# Patient Record
Sex: Female | Born: 1970 | Race: White | Hispanic: No | Smoking: Current every day smoker
Health system: Southern US, Community
[De-identification: ages and names within clinical notes are randomized; demographics above are authoritative.]

## PROBLEM LIST (undated history)

## (undated) DIAGNOSIS — Z9889 Other specified postprocedural states: Secondary | ICD-10-CM

## (undated) DIAGNOSIS — F319 Bipolar disorder, unspecified: Secondary | ICD-10-CM

## (undated) DIAGNOSIS — F429 Obsessive-compulsive disorder, unspecified: Secondary | ICD-10-CM

## (undated) DIAGNOSIS — F909 Attention-deficit hyperactivity disorder, unspecified type: Secondary | ICD-10-CM

## (undated) HISTORY — PX: TONSILLECTOMY AND ADENOIDECTOMY: SHX28

## (undated) HISTORY — DX: Other specified postprocedural states: Z98.890

## (undated) HISTORY — PX: TUBAL LIGATION: SHX77

## (undated) HISTORY — PX: OTHER SURGICAL HISTORY: SHX169

## (undated) HISTORY — DX: Attention-deficit hyperactivity disorder, unspecified type: F90.9

## (undated) HISTORY — DX: Obsessive-compulsive disorder, unspecified: F42.9

## (undated) HISTORY — DX: Bipolar disorder, unspecified: F31.9

---

## 1999-05-17 ENCOUNTER — Encounter (INDEPENDENT_AMBULATORY_CARE_PROVIDER_SITE_OTHER): Payer: Self-pay | Admitting: *Deleted

## 1999-05-17 ENCOUNTER — Ambulatory Visit (HOSPITAL_BASED_OUTPATIENT_CLINIC_OR_DEPARTMENT_OTHER): Admission: RE | Admit: 1999-05-17 | Discharge: 1999-05-18 | Payer: Self-pay | Admitting: *Deleted

## 1999-06-02 ENCOUNTER — Other Ambulatory Visit: Admission: RE | Admit: 1999-06-02 | Discharge: 1999-06-02 | Payer: Self-pay | Admitting: Obstetrics and Gynecology

## 2000-06-22 ENCOUNTER — Inpatient Hospital Stay (HOSPITAL_COMMUNITY): Admission: EM | Admit: 2000-06-22 | Discharge: 2000-06-27 | Payer: Self-pay | Admitting: *Deleted

## 2000-08-22 ENCOUNTER — Ambulatory Visit (HOSPITAL_COMMUNITY): Admission: RE | Admit: 2000-08-22 | Discharge: 2000-08-22 | Payer: Self-pay | Admitting: Family Medicine

## 2000-08-22 ENCOUNTER — Encounter: Payer: Self-pay | Admitting: Family Medicine

## 2000-09-12 ENCOUNTER — Ambulatory Visit (HOSPITAL_COMMUNITY): Admission: RE | Admit: 2000-09-12 | Discharge: 2000-09-12 | Payer: Self-pay | Admitting: Psychiatry

## 2000-11-24 ENCOUNTER — Encounter: Admission: RE | Admit: 2000-11-24 | Discharge: 2000-11-24 | Payer: Self-pay | Admitting: *Deleted

## 2001-01-15 ENCOUNTER — Encounter: Admission: RE | Admit: 2001-01-15 | Discharge: 2001-01-15 | Payer: Self-pay | Admitting: *Deleted

## 2001-09-12 ENCOUNTER — Encounter: Admission: RE | Admit: 2001-09-12 | Discharge: 2001-09-12 | Payer: Self-pay | Admitting: *Deleted

## 2001-10-24 ENCOUNTER — Inpatient Hospital Stay (HOSPITAL_COMMUNITY): Admission: AD | Admit: 2001-10-24 | Discharge: 2001-10-26 | Payer: Self-pay | Admitting: Obstetrics & Gynecology

## 2001-10-25 ENCOUNTER — Encounter: Payer: Self-pay | Admitting: Obstetrics and Gynecology

## 2001-12-03 ENCOUNTER — Encounter: Payer: Self-pay | Admitting: Obstetrics and Gynecology

## 2001-12-03 ENCOUNTER — Ambulatory Visit (HOSPITAL_COMMUNITY): Admission: RE | Admit: 2001-12-03 | Discharge: 2001-12-03 | Payer: Self-pay | Admitting: Obstetrics and Gynecology

## 2001-12-09 ENCOUNTER — Ambulatory Visit (HOSPITAL_COMMUNITY): Admission: AD | Admit: 2001-12-09 | Discharge: 2001-12-09 | Payer: Self-pay | Admitting: Obstetrics and Gynecology

## 2001-12-13 ENCOUNTER — Ambulatory Visit (HOSPITAL_COMMUNITY): Admission: AD | Admit: 2001-12-13 | Discharge: 2001-12-13 | Payer: Self-pay | Admitting: Obstetrics & Gynecology

## 2001-12-16 ENCOUNTER — Ambulatory Visit (HOSPITAL_COMMUNITY): Admission: RE | Admit: 2001-12-16 | Discharge: 2001-12-16 | Payer: Self-pay | Admitting: Obstetrics and Gynecology

## 2001-12-19 ENCOUNTER — Ambulatory Visit (HOSPITAL_COMMUNITY): Admission: AD | Admit: 2001-12-19 | Discharge: 2001-12-19 | Payer: Self-pay | Admitting: Obstetrics & Gynecology

## 2001-12-23 ENCOUNTER — Inpatient Hospital Stay (HOSPITAL_COMMUNITY): Admission: RE | Admit: 2001-12-23 | Discharge: 2001-12-26 | Payer: Self-pay | Admitting: Obstetrics & Gynecology

## 2002-01-14 ENCOUNTER — Encounter: Admission: RE | Admit: 2002-01-14 | Discharge: 2002-01-14 | Payer: Self-pay | Admitting: *Deleted

## 2002-02-26 ENCOUNTER — Ambulatory Visit (HOSPITAL_COMMUNITY): Admission: RE | Admit: 2002-02-26 | Discharge: 2002-02-26 | Payer: Self-pay | Admitting: Obstetrics & Gynecology

## 2002-05-11 ENCOUNTER — Emergency Department (HOSPITAL_COMMUNITY): Admission: EM | Admit: 2002-05-11 | Discharge: 2002-05-11 | Payer: Self-pay | Admitting: *Deleted

## 2002-05-11 ENCOUNTER — Encounter: Payer: Self-pay | Admitting: *Deleted

## 2002-09-12 ENCOUNTER — Encounter: Admission: RE | Admit: 2002-09-12 | Discharge: 2002-09-12 | Payer: Self-pay | Admitting: *Deleted

## 2004-08-17 ENCOUNTER — Ambulatory Visit (HOSPITAL_COMMUNITY): Admission: RE | Admit: 2004-08-17 | Discharge: 2004-08-17 | Payer: Self-pay | Admitting: Family Medicine

## 2004-09-14 ENCOUNTER — Ambulatory Visit (HOSPITAL_COMMUNITY): Admission: RE | Admit: 2004-09-14 | Discharge: 2004-09-14 | Payer: Self-pay | Admitting: Family Medicine

## 2004-09-21 ENCOUNTER — Ambulatory Visit (HOSPITAL_COMMUNITY): Admission: RE | Admit: 2004-09-21 | Discharge: 2004-09-21 | Payer: Self-pay | Admitting: Family Medicine

## 2004-11-18 ENCOUNTER — Inpatient Hospital Stay (HOSPITAL_COMMUNITY): Admission: RE | Admit: 2004-11-18 | Discharge: 2004-11-22 | Payer: Self-pay | Admitting: Psychiatry

## 2004-11-19 ENCOUNTER — Ambulatory Visit: Payer: Self-pay | Admitting: Psychiatry

## 2005-07-16 ENCOUNTER — Emergency Department (HOSPITAL_COMMUNITY): Admission: EM | Admit: 2005-07-16 | Discharge: 2005-07-16 | Payer: Self-pay | Admitting: Emergency Medicine

## 2005-07-19 ENCOUNTER — Emergency Department (HOSPITAL_COMMUNITY): Admission: EM | Admit: 2005-07-19 | Discharge: 2005-07-19 | Payer: Self-pay | Admitting: Emergency Medicine

## 2006-04-24 ENCOUNTER — Ambulatory Visit: Payer: Self-pay | Admitting: Psychiatry

## 2006-04-24 ENCOUNTER — Inpatient Hospital Stay (HOSPITAL_COMMUNITY): Admission: RE | Admit: 2006-04-24 | Discharge: 2006-04-28 | Payer: Self-pay | Admitting: Psychiatry

## 2007-03-13 IMAGING — CR DG LUMBAR SPINE COMPLETE 4+V
5 series · 5 of 5 positions shown · non-contrast
Comparison: none

CLINICAL DATA: 33-year-old female, low back pain radiating into the left lower extremity. 
 LUMBAR SPINE ? 5 VIEW:

[view not recorded (1 of 5)]
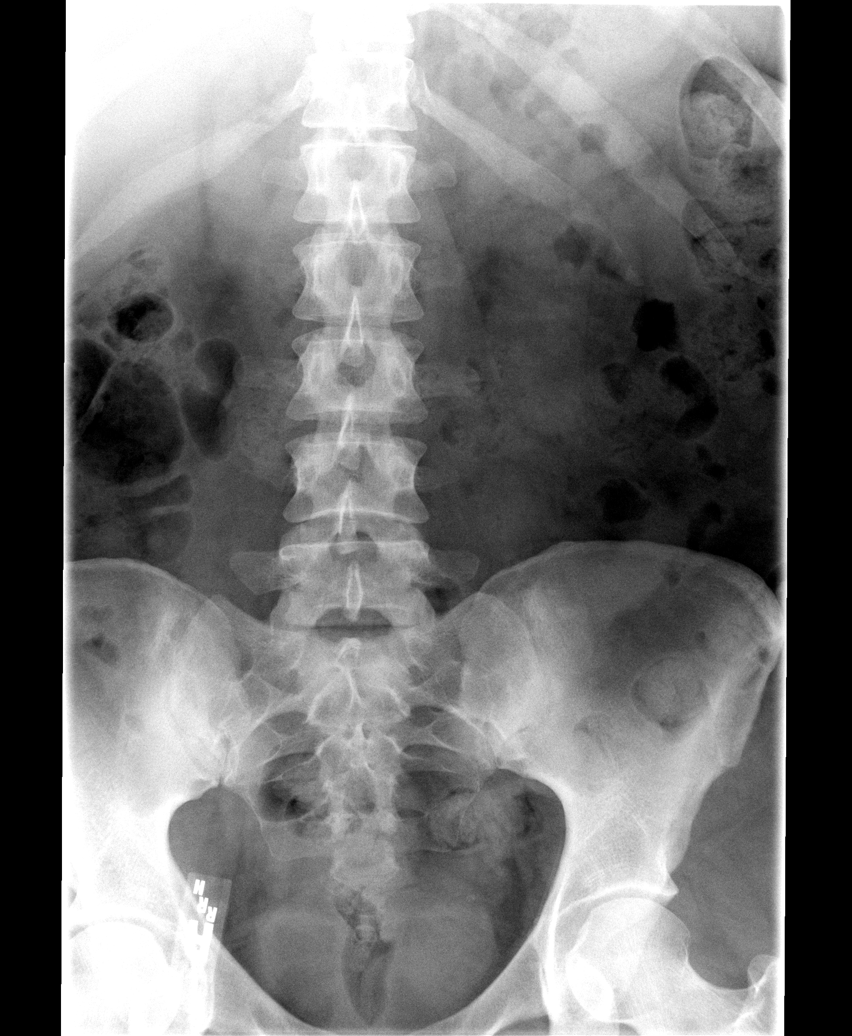

[view not recorded (2 of 5)]
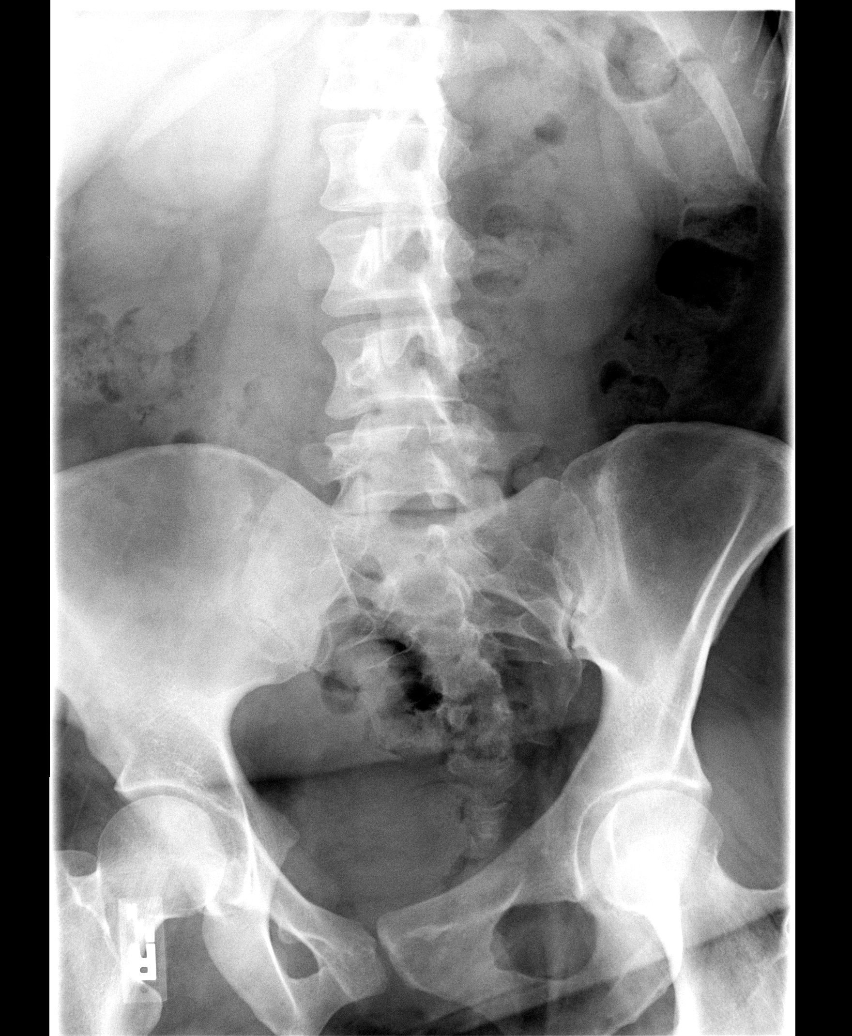

[view not recorded (3 of 5)]
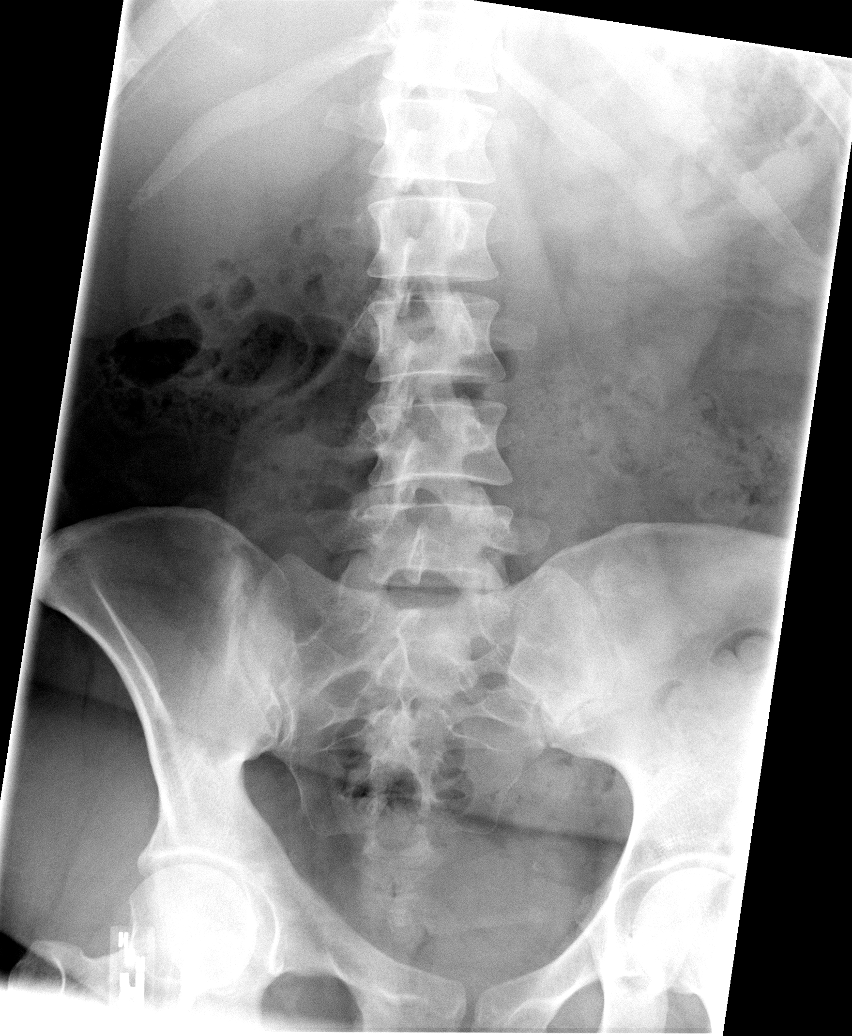

[view not recorded (4 of 5)]
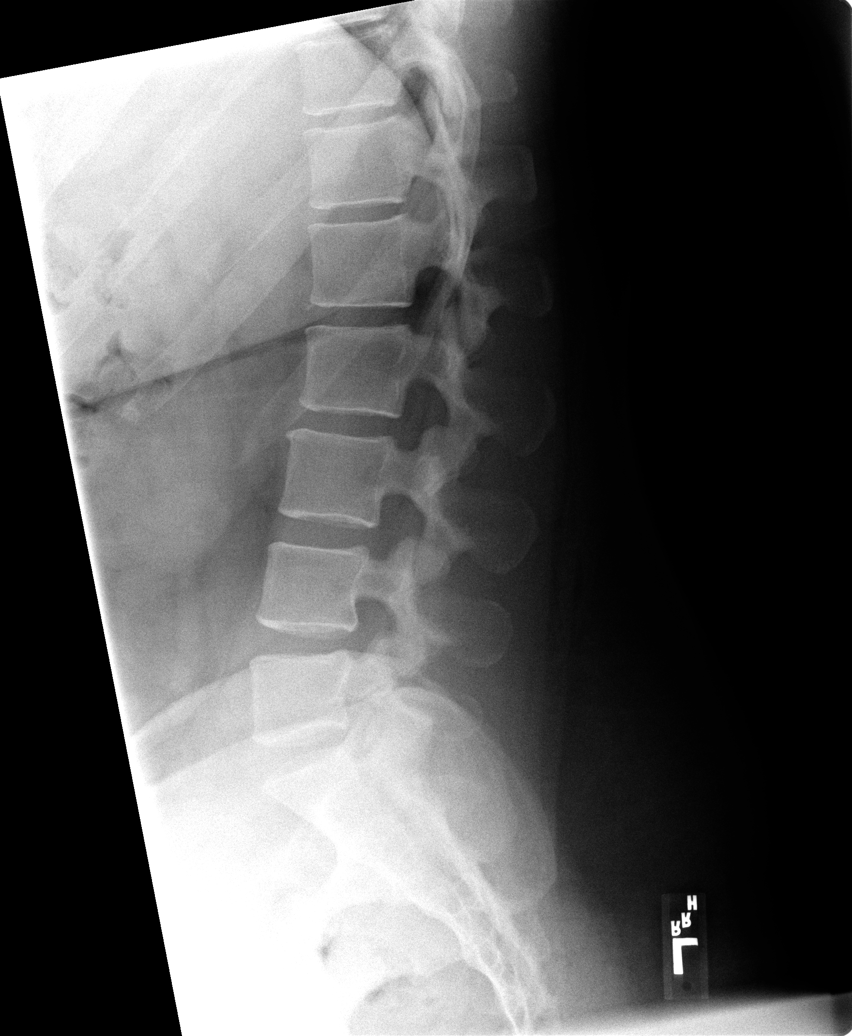

[view not recorded (5 of 5)]
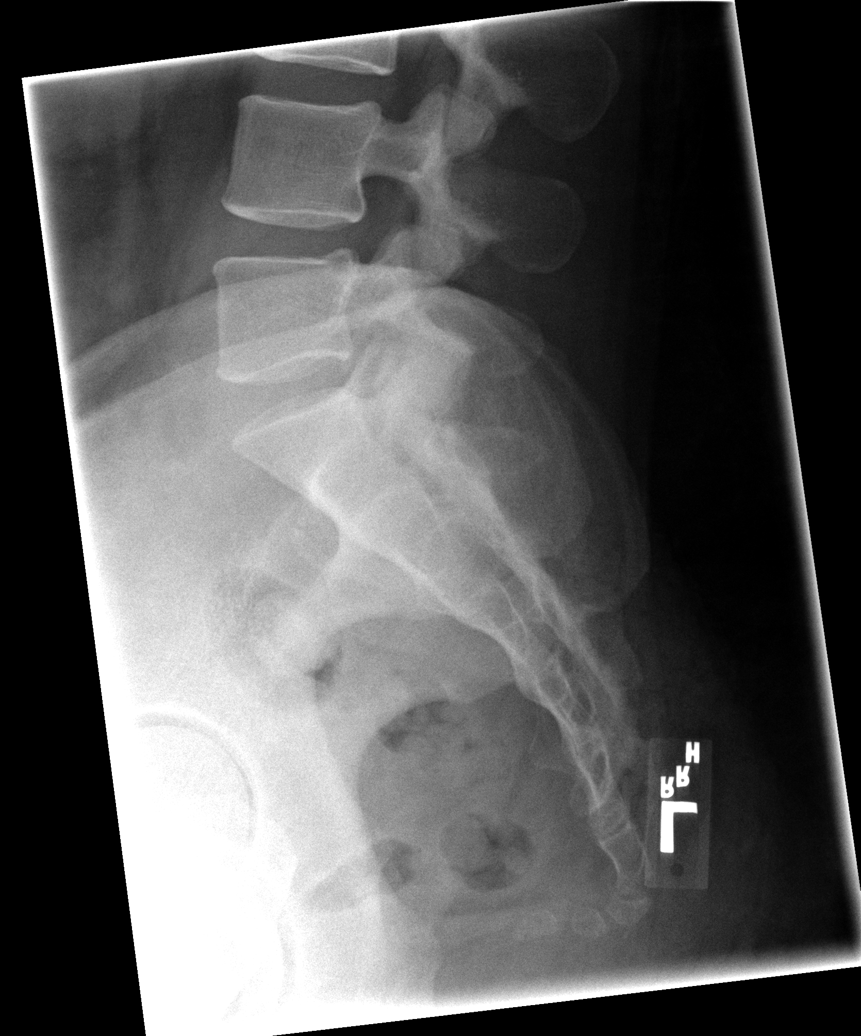

[5 of 5 positions shown; findings below may reference images not displayed]

FINDINGS: No compression fracture deformity.  Alignment is anatomic.  Vertebral body heights and disc spaces are preserved.  Pedicles are intact.  No definite pars defects.
IMPRESSION: No acute finding by plain radiography.

## 2007-05-30 ENCOUNTER — Emergency Department (HOSPITAL_COMMUNITY): Admission: EM | Admit: 2007-05-30 | Discharge: 2007-05-30 | Payer: Self-pay | Admitting: Emergency Medicine

## 2007-08-01 ENCOUNTER — Emergency Department (HOSPITAL_COMMUNITY): Admission: EM | Admit: 2007-08-01 | Discharge: 2007-08-01 | Payer: Self-pay | Admitting: Emergency Medicine

## 2008-01-08 ENCOUNTER — Emergency Department (HOSPITAL_COMMUNITY): Admission: EM | Admit: 2008-01-08 | Discharge: 2008-01-09 | Payer: Self-pay | Admitting: Emergency Medicine

## 2008-02-25 ENCOUNTER — Ambulatory Visit (HOSPITAL_COMMUNITY): Admission: RE | Admit: 2008-02-25 | Discharge: 2008-02-25 | Payer: Self-pay | Admitting: Family Medicine

## 2008-03-10 ENCOUNTER — Encounter (HOSPITAL_COMMUNITY): Admission: RE | Admit: 2008-03-10 | Discharge: 2008-04-09 | Payer: Self-pay | Admitting: Orthopaedic Surgery

## 2008-03-12 ENCOUNTER — Ambulatory Visit (HOSPITAL_COMMUNITY): Admission: RE | Admit: 2008-03-12 | Discharge: 2008-03-12 | Payer: Self-pay | Admitting: Family Medicine

## 2008-04-07 ENCOUNTER — Other Ambulatory Visit: Admission: RE | Admit: 2008-04-07 | Discharge: 2008-04-07 | Payer: Self-pay | Admitting: Obstetrics and Gynecology

## 2008-05-07 ENCOUNTER — Ambulatory Visit (HOSPITAL_COMMUNITY): Admission: RE | Admit: 2008-05-07 | Discharge: 2008-05-07 | Payer: Self-pay | Admitting: Obstetrics & Gynecology

## 2008-05-07 ENCOUNTER — Encounter: Payer: Self-pay | Admitting: Obstetrics & Gynecology

## 2008-05-16 ENCOUNTER — Inpatient Hospital Stay (HOSPITAL_COMMUNITY): Admission: AD | Admit: 2008-05-16 | Discharge: 2008-05-19 | Payer: Self-pay | Admitting: *Deleted

## 2008-05-16 ENCOUNTER — Ambulatory Visit: Payer: Self-pay | Admitting: *Deleted

## 2009-06-03 DIAGNOSIS — Z9889 Other specified postprocedural states: Secondary | ICD-10-CM

## 2009-06-03 HISTORY — DX: Other specified postprocedural states: Z98.890

## 2009-07-21 ENCOUNTER — Ambulatory Visit: Payer: Self-pay | Admitting: Gastroenterology

## 2009-07-21 DIAGNOSIS — K219 Gastro-esophageal reflux disease without esophagitis: Secondary | ICD-10-CM

## 2009-07-21 DIAGNOSIS — R1013 Epigastric pain: Secondary | ICD-10-CM

## 2009-07-28 ENCOUNTER — Encounter: Payer: Self-pay | Admitting: Gastroenterology

## 2009-07-29 ENCOUNTER — Ambulatory Visit (HOSPITAL_COMMUNITY): Admission: RE | Admit: 2009-07-29 | Discharge: 2009-07-29 | Payer: Self-pay | Admitting: Gastroenterology

## 2009-07-29 ENCOUNTER — Ambulatory Visit: Payer: Self-pay | Admitting: Gastroenterology

## 2009-08-17 ENCOUNTER — Encounter: Payer: Self-pay | Admitting: Gastroenterology

## 2009-08-24 ENCOUNTER — Encounter: Payer: Self-pay | Admitting: Gastroenterology

## 2009-09-04 ENCOUNTER — Encounter: Payer: Self-pay | Admitting: Gastroenterology

## 2009-09-09 ENCOUNTER — Encounter (HOSPITAL_COMMUNITY): Admission: RE | Admit: 2009-09-09 | Discharge: 2009-09-09 | Payer: Self-pay | Admitting: Gastroenterology

## 2009-09-10 ENCOUNTER — Encounter: Payer: Self-pay | Admitting: Gastroenterology

## 2009-10-15 ENCOUNTER — Encounter (INDEPENDENT_AMBULATORY_CARE_PROVIDER_SITE_OTHER): Payer: Self-pay | Admitting: *Deleted

## 2009-12-01 ENCOUNTER — Encounter: Payer: Self-pay | Admitting: Urgent Care

## 2010-01-24 ENCOUNTER — Encounter: Payer: Self-pay | Admitting: Orthopaedic Surgery

## 2010-01-24 ENCOUNTER — Encounter: Payer: Self-pay | Admitting: Gastroenterology

## 2010-01-24 ENCOUNTER — Encounter: Payer: Self-pay | Admitting: Family Medicine

## 2010-01-27 ENCOUNTER — Encounter: Payer: Self-pay | Admitting: Gastroenterology

## 2010-02-02 NOTE — Letter (Signed)
Summary: release of information to dr comstock's office  release of information to dr comstock's office   Imported By: Rosine Beat 07/28/2009 13:46:54  _____________________________________________________________________  External Attachment:    Type:   Image     Comment:   External Document

## 2010-02-02 NOTE — Letter (Signed)
Summary: RECORD FROM [facility]  RECORD FROM [facility]   Imported By: Rexene Alberts 08/17/2009 12:17:47  _____________________________________________________________________  External Attachment:    Type:   Image     Comment:   External Document

## 2010-02-02 NOTE — Letter (Signed)
Summary: egd order  egd order   Imported By: Hendricks Limes LPN 16/10/9602 54:09:81  _____________________________________________________________________  External Attachment:    Type:   Image     Comment:   External Document

## 2010-02-02 NOTE — Assessment & Plan Note (Signed)
Summary: ABD PAIN,GERD   Visit Type:  Initial Consult Referring Provider:  Jorene Guest Primary Care Provider:  Jorene Guest  Chief Complaint:  abd pain/gerd.  History of Present Illness: heartburn for several years. "getting worse. Pain in upper abd. Episodic. Not a stabbing pain. Squeezing or relieved with pressure. Worse since past 4-6 mos. Interfering with work. Brougt on in the AM or at night. No preci[itating factyors Not related to eating or stress. Feels nausea sometimes vomting. No Etoh, ASA, BC, Goody's. Uses Ibuprofen 1-2 a day 800 mg for migraines.stress HAs. No Aleve. Cigs: 1 pk q3-4 days. Feels different than usus/ heartburn. No wieght loss. 5 years ago heavier: 300 lbs to 150 lbs and now been at 225 lbs for about a year. Eats less fried food. BM: once every other day: normal. Pain better-time or pressure. Last 15-45 mins or eases up and dull pressure. No bloating or diarrhea. UGI  ~ 11 years ago.  Preventive Screening-Counseling & Management  Caffeine-Diet-Exercise     Does Patient Exercise: yes  Current Medications (verified): 1)  Adderall Xr 30 Mg Xr24h-Cap (Amphetamine-Dextroamphetamine) .... Take Two Tablets in The Am 2)  Omeprazole 20 Mg Cpdr (Omeprazole) .... Take 1 Tablet By Mouth Once A Day 3)  Depakote 500 Mg Tbec (Divalproex Sodium) .... One Tablet in The Am and Two Tablets in The Pm 4)  Wellbutrin Xl 150 Mg Xr24h-Tab (Bupropion Hcl) .... Take 1 Tablet By Mouth Once A Day 5)  Trazodone Hcl 150 Mg Tabs (Trazodone Hcl) .... Two Tablets At Bedtime 6)  Ibuprofen 800 Mg Tabs (Ibuprofen) .... As Needed 7)  Sumatriptan Succinate 100 Mg Tabs (Sumatriptan Succinate) .... As Needed 8)  Robaxin 500 Mg Tabs (Methocarbamol) .... As Needed 9)  Lunesta 3 Mg Tabs (Eszopiclone) .... As Needed  Allergies (verified): 1)  ! 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 Does Patient Exercise:  yes  Review of Systems       No melelna or hematochezia.   Per HPI oitherwise all systems negative.  Vital Signs:  Patient profile:   40 year old female Height:      68 inches Weight:      225.50 pounds BMI:     34.41 Temp:     98.3 degrees F oral Pulse rate:   84 / minute BP sitting:   100 / 78  (left arm) Cuff size:   large  Vitals Entered By: Cloria Spring LPN (July 21, 2009 3:55 PM)  Physical Exam  General:  Well developed, well nourished, no acute distress. Head:  Normocephalic and atraumatic. Eyes:  PERRLA, no icterus. Mouth:  No deformity or lesions. Neck:  Supple; no masses. Lungs:  Clear throughout to auscultation. Heart:  Regular rate and rhythm; no murmurs. Abdomen:  Soft, mild TTP in epigastrium, nondistended. No masses,  or hernias noted. Normal bowel sounds. obese.   Extremities:  No edema or deformities noted. Neurologic:  Alert and  oriented x4;  grossly normal neurologically.  Impression & Recommendations:  Problem # 1:  EPIGASTRIC PAIN (ICD-789.06) Assessment New  Differential diagnosis inlcudes NSAID gastritis, H. pylori gastritis, non-ulcer dyspepsia, erosive esophagitis, or eosinophilic gastritis, doubt pancreatobiliary problem.  Get recent labs from dr. Jorene Guest. EGD 7/27 w/ propofol due to multiple psychoactive meds. OPV in 3 mos. May  need a TCA.  Orders: Consultation Level III (04540)  Problem # 2:  GERD (ICD-530.81)  Not ideally controled on once daily OMP. Increase to two times a day. OPV in 3 mos.  CC: PCP  Orders: Consultation Level III (98119) Prescriptions: OMEPRAZOLE 20 MG CPDR (OMEPRAZOLE) Take 1 tablet by mouth 30 minutes prior meals BID  #60 x 5   Entered and Authorized by:   West Bali MD   Signed by:   West Bali MD on 07/21/2009   Method used:   Electronically to         [facility] (retail)       [address]       [place], [place]  [address]       Ph: [phone]       Fax: [phone]   RxID:   [id number]   Appended Document: ABD PAIN,GERD June 2011 228 LBS BMI 34.67 **wbc 8.4 HB 15.2 PLT 229 AMYLASE 24  LIPASE 32 HPYLORI AB NEG  MAY 2011 CHOL 212 CR 0.91 AST 8 ALT 8 ALB 4.8 ALK PHOS 41

## 2010-02-02 NOTE — Medication Information (Signed)
Summary: PROMETHAZINE HCL 25MG  TAB  PROMETHAZINE HCL 25MG  TAB   Imported By: Rexene Alberts 12/01/2009 13:39:18  _____________________________________________________________________  External Attachment:    Type:   Image     Comment:   External Document  Appended Document: PROMETHAZINE HCL 25MG  TAB    Prescriptions: PROMETHAZINE HCL 25 MG TABS (PROMETHAZINE HCL) 1/2 to one by mouth every 6 hours as needed nausea/vomiting  #20 x 0   Entered and Authorized by:   Joselyn Arrow FNP-BC   Signed by:   Joselyn Arrow FNP-BC on 12/01/2009   Method used:   Electronically to        [facility] (retail)       [address] [place], [place]  [address]       Ph: [phone]       Fax: [phone]   RxID:   [id number]

## 2010-02-02 NOTE — Medication Information (Signed)
Summary: PROMETHAZINE HCL 25MG   PROMETHAZINE HCL 25MG    Imported By: Rexene Alberts 09/10/2009 08:22:26  _____________________________________________________________________  External Attachment:    Type:   Image     Comment:   External Document  Appended Document: PROMETHAZINE HCL 25MG     Prescriptions: PROMETHAZINE HCL 25 MG TABS (PROMETHAZINE HCL) 1/2 to one by mouth every 6 hours as needed nausea/vomiting  #20 x 0   Entered and Authorized by:   Leanna Battles. Dixon Boos   Signed by:   Leanna Battles Dixon Boos on 09/11/2009   Method used:   Electronically to        [facility] (retail)       [address]       [place], [place]  [address]       Ph: [phone]       Fax: [phone]   RxID:   [id number]

## 2010-02-02 NOTE — Letter (Signed)
Summary: GES order   GES order   Imported By: Peggyann Shoals 08/24/2009 12:55:16  _____________________________________________________________________  External Attachment:    Type:   Image     Comment:   External Document

## 2010-02-02 NOTE — Letter (Signed)
Summary: GES ORDER  GES ORDER   Imported By: Ave Filter 09/04/2009 09:37:14  _____________________________________________________________________  External Attachment:    Type:   Image     Comment:   External Document

## 2010-02-02 NOTE — Letter (Signed)
Summary: Recall Office Visit  [facility]  [address]   [place], [place] [address]   Phone: [phone]  Fax: [phone]      October 15, 2009   [facility] [place] [address] [place], [place]  [address] [birth year]   Dear Ms. Patil,   According to our records, it is time for you to schedule a follow-up office visit with [place].   At your convenience, please call [phone] to schedule an office visit. If you have any questions, concerns, or feel that this letter is in error, we would appreciate your call.   Sincerely,    Diana Eves  [facility] Ph: [phone]   Fax: [phone]

## 2010-02-04 NOTE — Medication Information (Signed)
Summary: OMEPRAZOLE 20MG   OMEPRAZOLE 20MG    Imported By: Rexene Alberts 01/27/2010 15:52:50  _____________________________________________________________________  External Attachment:    Type:   Image     Comment:   External Document  Appended Document: OMEPRAZOLE 20MG     Prescriptions: OMEPRAZOLE 20 MG CPDR (OMEPRAZOLE) Take 1 tablet by mouth 30 minutes prior meals BID  #60 x 5   Entered and Authorized by:   Gerrit Halls NP   Signed by:   Gerrit Halls NP on 01/28/2010   Method used:   Faxed to ...       [facility] (retail)       [address]       [place], [place]  [address]       Ph: [phone]       Fax: [phone]   RxID:   [id number]

## 2010-03-20 LAB — BASIC METABOLIC PANEL
BUN: 8 mg/dL (ref 6–23)
CO2: 27 mEq/L (ref 19–32)
Chloride: 106 mEq/L (ref 96–112)
Glucose, Bld: 62 mg/dL — ABNORMAL LOW (ref 70–99)
Potassium: 3.9 mEq/L (ref 3.5–5.1)
Sodium: 141 mEq/L (ref 135–145)

## 2010-03-26 ENCOUNTER — Other Ambulatory Visit: Payer: Self-pay | Admitting: Gastroenterology

## 2010-03-29 NOTE — Telephone Encounter (Signed)
Needs OV 1st 

## 2010-04-01 NOTE — Telephone Encounter (Signed)
I have LMOM for pt to call me back to set up her OV

## 2010-04-07 ENCOUNTER — Other Ambulatory Visit: Payer: Self-pay

## 2010-04-07 MED ORDER — PROMETHAZINE HCL 25 MG PO TABS: 25.0000 mg | ORAL_TABLET | Freq: Four times a day (QID) | ORAL | Status: AC | PRN

## 2010-04-07 NOTE — Telephone Encounter (Signed)
No reply from patient. I mailed letter for pt to call us to set up her OV to further her refills

## 2010-04-13 LAB — COMPREHENSIVE METABOLIC PANEL
Albumin: 3.4 g/dL — ABNORMAL LOW (ref 3.5–5.2)
BUN: 10 mg/dL (ref 6–23)
Calcium: 9.2 mg/dL (ref 8.4–10.5)
Creatinine, Ser: 0.61 mg/dL (ref 0.4–1.2)
Glucose, Bld: 84 mg/dL (ref 70–99)
Total Protein: 5.5 g/dL — ABNORMAL LOW (ref 6.0–8.3)

## 2010-04-13 LAB — URINALYSIS, ROUTINE W REFLEX MICROSCOPIC
Glucose, UA: NEGATIVE mg/dL
Leukocytes, UA: NEGATIVE
Nitrite: POSITIVE — AB
pH: 6 (ref 5.0–8.0)

## 2010-04-13 LAB — CBC
HCT: 34.5 % — ABNORMAL LOW (ref 36.0–46.0)
Hemoglobin: 12 g/dL (ref 12.0–15.0)
MCHC: 34.8 g/dL (ref 30.0–36.0)
MCV: 90.9 fL (ref 78.0–100.0)
RBC: 3.8 MIL/uL — ABNORMAL LOW (ref 3.87–5.11)
WBC: 6.4 10*3/uL (ref 4.0–10.5)

## 2010-04-13 LAB — URINE MICROSCOPIC-ADD ON

## 2010-04-13 LAB — HCG, QUANTITATIVE, PREGNANCY: hCG, Beta Chain, Quant, S: <2 m[IU]/mL (ref <5)

## 2010-05-13 ENCOUNTER — Ambulatory Visit (INDEPENDENT_AMBULATORY_CARE_PROVIDER_SITE_OTHER): Payer: Self-pay | Admitting: Gastroenterology

## 2010-05-13 ENCOUNTER — Encounter: Payer: Self-pay | Admitting: Gastroenterology

## 2010-05-13 VITALS — BP 137/87 | HR 91 | Temp 98.1°F | Ht 68.0 in | Wt 209.8 lb

## 2010-05-13 DIAGNOSIS — R61 Generalized hyperhidrosis: Secondary | ICD-10-CM

## 2010-05-13 DIAGNOSIS — R1013 Epigastric pain: Secondary | ICD-10-CM

## 2010-05-13 DIAGNOSIS — K219 Gastro-esophageal reflux disease without esophagitis: Secondary | ICD-10-CM

## 2010-05-13 LAB — TSH: TSH: 2.873 u[IU]/mL (ref 0.350–4.500)

## 2010-05-13 MED ORDER — PROMETHAZINE HCL 25 MG PO TABS: 25.0000 mg | ORAL_TABLET | Freq: Four times a day (QID) | ORAL | Status: DC | PRN

## 2010-05-13 MED ORDER — OMEPRAZOLE 20 MG PO CPDR: 20.0000 mg | DELAYED_RELEASE_CAPSULE | Freq: Two times a day (BID) | ORAL | Status: DC

## 2010-05-13 NOTE — Progress Notes (Signed)
Cc to PCP 

## 2010-05-13 NOTE — Progress Notes (Signed)
Referring Provider: Reynolds Bowl, MD Primary Care Physician:  Reynolds Bowl, MD Primary Gastroenterologist: Dr. Darrick Penna   Chief Complaint  Patient presents with  . Medication Refill    HPI:   Ms. Henault returns today in f/u for GERD, upper abdominal pain, nausea. EGD June 2011: mild gastritis. On Prilosec BID, which relieves symptoms as long as she takes it. Off NSAIDs. Underwent GES due to nausea, which was normal. Abdominal pain significantly improved. Was 225 last visit, no 209. Only complaint is of profuse sweating at times. Usually late in evening. Stays "hot".  Needs refill on phenergan and prilosec. Rarely uses phenergan but likes to have on hand in case of nausea.   Past Medical History  Diagnosis Date  . ADD (attention deficit disorder with hyperactivity)   . OCD (obsessive compulsive disorder)   . Bipolar disorder   . S/P endoscopy June 2011    Dr. Darrick Penna: mild gastritis    Past Surgical History  Procedure Date  . Tonsillectomy and adenoidectomy   . Tubal ligation   . Water ablation therapy of cervix     Current Outpatient Prescriptions  Medication Sig Dispense Refill  . amphetamine-dextroamphetamine (ADDERALL) 30 MG tablet Take 30 mg by mouth 2 (two) times daily.        Marland Kitchen buPROPion (WELLBUTRIN XL) 300 MG 24 hr tablet Take 300 mg by mouth daily.        . cetirizine (ZYRTEC) 10 MG tablet Take 10 mg by mouth daily.        . divalproex (DEPAKOTE) 500 MG 24 hr tablet Take 500 mg by mouth 3 (three) times daily.        . Eszopiclone (ESZOPICLONE) 3 MG TABS Take 3 mg by mouth at bedtime. Take immediately before bedtime       . methocarbamol (ROBAXIN) 500 MG tablet Take 500 mg by mouth 4 (four) times daily.        Marland Kitchen omeprazole (PRILOSEC) 20 MG capsule Take 1 capsule (20 mg total) by mouth 2 (two) times daily.  62 capsule  5  . promethazine (PHENERGAN) 25 MG tablet Take 1 tablet (25 mg total) by mouth every 6 (six) hours as needed for nausea.  30 tablet  0  . Rizatriptan  Benzoate (MAXALT-MLT PO) Take 10 mg by mouth daily as needed.        . topiramate (TOPAMAX) 25 MG tablet Take 25 mg by mouth 2 (two) times daily.        . traZODone (DESYREL) 150 MG tablet Take 150 mg by mouth 2 (two) times daily.          Allergies as of 05/13/2010 - Review Complete 05/13/2010  Allergen Reaction Noted  . Penicillins      History   Social History  . Marital Status: Legally Separated    Spouse Name: N/A    Number of Children: N/A  . Years of Education: N/A   Social History Main Topics  . Smoking status: Current Everyday Smoker -- 0.3 packs/day for 20 years    Types: Cigarettes    Review of Systems: Gen: Denies fever, chills, anorexia. Denies fatigue, weakness, complains of weight loss.  CV: Denies chest pain, palpitations, syncope, peripheral edema, and claudication. Resp: Denies dyspnea at rest, cough, wheezing, coughing up blood, and pleurisy. GI: Denies vomiting blood, jaundice, and fecal incontinence.   Denies dysphagia or odynophagia. Derm: Denies rash, itching, dry skin Psych: Denies depression, anxiety, memory loss, confusion. No homicidal or suicidal ideation.  Heme: Denies bruising,  bleeding, and enlarged lymph nodes.  Physical Exam: BP 137/87  Pulse 91  Temp(Src) 98.1 F (36.7 C) (Tympanic)  Ht 5\' 8"  (1.727 m)  Wt 209 lb 12.8 oz (95.165 kg)  BMI 31.90 kg/m2 General:   Alert and oriented. No distress noted. Pleasant and cooperative.  Head:  Normocephalic and atraumatic. Eyes:  Conjuctiva clear without scleral icterus. Mouth:  Oral mucosa pink and moist. Good dentition. No lesions. Heart:  S1, S2 present without murmurs, rubs, or gallops. Regular rate and rhythm. Lungs: Clear to auscultation bilaterally. No wheezes, rales, or rhonchi.  Abdomen:  +BS, soft, non-tender and non-distended. No rebound or guarding. No HSM or masses noted. Msk:  Symmetrical without gross deformities. Normal posture. Extremities:  Without edema. Neurologic:  Alert and   oriented x4;  grossly normal neurologically. Skin:  Intact without significant lesions or rashes. Psych:  Alert and cooperative. Normal mood and affect.

## 2010-05-13 NOTE — Assessment & Plan Note (Signed)
Controlled as long as on BID Prilosec. Continue Prilosec. Refill for Prilosec and 1 refill for Phenergan. Return in 6 mos.

## 2010-05-13 NOTE — Assessment & Plan Note (Signed)
Significantly improved if not resolved. Continue Prilosec BID. Continue to avoid NSAIDs. Refill for Prilosec given. F/U in 6 mos.

## 2010-05-13 NOTE — Patient Instructions (Signed)
Continue Prilosec twice a day.   Have lab drawn; we will call you with results.  Follow-up with gynecologist regarding your other issues.   We will see you back in 6 months.

## 2010-05-13 NOTE — Assessment & Plan Note (Signed)
Notes increase in sweating. Usually in evenings. Has lost weight, down from 225 to 209 since last July. Will check TSH. Doubt this is an endocrine issue; pt to follow-up with gyn for possible further work-up.

## 2010-05-18 NOTE — Discharge Summary (Signed)
Jenna Floyd, Jenna Floyd            ACCOUNT NO.:  [id number]   MEDICAL RECORD NO.:  [id number]          PATIENT TYPE:  IPS   LOCATION:  0307                          FACILITY:  [facility]   PHYSICIAN:  Jasmine Pang, M.D. DATE OF BIRTH:  [birth year]   DATE OF ADMISSION:  05/16/2008  DATE OF DISCHARGE:  05/19/2008                               DISCHARGE SUMMARY   IDENTIFYING INFORMATION:  This is a 40 year old separated white female  who was admitted on a voluntary basis on May 16, 2008.   HISTORY OF PRESENT ILLNESS:  The patient presented here to the [facility]  She stated she was just really depressed for the past  week.  She cannot identify any precipitating stressors.  She continues  to endorse feeling suicidal.  When asked about plans, the patient  replies that she has Different ones, but I would rather not tell  anyone.  She states she completely stopped all of her prescribed  medications after she failed to suicide by overdose last week.  She  states she has poor appetite and sleep.  She does use marijuana at least  weekly and states if we could fix one symptom today would be to address  her depression.  She is followed outpatient by Ellis Savage and  therapist, Mena Pauls.  For further information, see psychiatric  admission assessment.  Upon admission, she was given an Axis I disorder  of mood disorder, not otherwise specified.  She was given an Axis III  disorder of migraine headaches.   HOSPITAL COURSE:  Upon admission, the patient was started on Restoril 30  mg p.o. q.h.s. p.r.n. insomnia, hydrocodone 5/325 mg with APAP every 6  hours p.r.n. pain, and MiraLax 17 g a day p.r.n.  She was also started  on Abilify 10 mg p.o. q.a.m. and erythromycin 250 mg p.o. q.i.d. due to  a urinary tract infection.  In addition, she was started on Maxalt 10 mg  p.o. at onset of headache, may repeat in 1 hour if needed.  In  individual sessions, the patient was initially disheveled  with minimal  eye contact.  There was psychomotor retardation.  Speech was soft and  slow.  Mood was depressed and anxious.  Affect was consistent with mood.  There was positive suicidal ideation.  There was no evidence of  psychosis or a thought disorder.  On May 18, 2008, the patient was  having difficulty sleeping.  She stated she was having nightmares.  She  wanted to be taken off the Restoril because she felt this was  contributing to her nightmares.  This was discontinued and she was  started on trazodone 50 mg p.o. q.h.s. p.r.n. insomnia and she was  becoming less depressed, less anxious.  She talked about her mother and  her 4 children.  She appeared very attached to them.  She stated her  mother is currently taking care of the children.  She was having no side  effects on the Abilify.  On May 19, 2008, sleep was good, appetite was  good.  Mood was less depressed, less anxious.  Affect  consistent with  mood.  There is no suicidal or homicidal ideation.  No thoughts of self-  injurious behavior.  No auditory or visual hallucinations.  No paranoia  or delusions.  Thoughts were logical and goal-directed, thought content.  No predominant theme.  Cognitive was grossly intact.  Insight good,  judgment good, impulse control good.  It was felt the patient was safe  for discharge today and wanted to go home.   DISCHARGE DIAGNOSES:  Axis I:  Mood disorder, not otherwise specified.  Axis II:  None.  Axis III:  Migraine headaches, urinary tract infection.  Axis IV:  Moderate (burden of psychiatric illness, burden of medical  problems, mother of 4 children).  Axis V:  Global assessment of functioning was 50 upon discharge.  GAF  was 35 upon admission.  GAF highest past was 60-65.   DISCHARGE PLANS:  There was no specific activity level or dietary  restrictions.   POSTHOSPITAL CARE PLANS:  The patient will see Ellis Savage, nurse  practitioner on June 10, 2008, at 9:30 a.m.  She will also  sees Mena Pauls on May 28, 2008, at 10 a.m.   DISCHARGE MEDICATIONS:  1. Abilify 10 mg daily.  2. Trazodone 50 mg 1-2 tablets at bedtime as needed for insomnia.  3. Resume Maxalt as prescribed.      Jasmine Pang, M.D.  Electronically Signed     BHS/MEDQ  D:  05/19/2008  T:  05/19/2008  Job:  [id number]

## 2010-05-18 NOTE — Op Note (Signed)
Jenna Floyd, Jenna Floyd            ACCOUNT NO.:  [id number]   MEDICAL RECORD NO.:  [id number]          PATIENT TYPE:  AMB   LOCATION:  DAY                           FACILITY:  [facility]   PHYSICIAN:  Lazaro Arms, M.D.   DATE OF BIRTH:  1970/07/24   DATE OF PROCEDURE:  05/07/2008  DATE OF DISCHARGE:  05/07/2008                               OPERATIVE REPORT   PREOPERATIVE DIAGNOSES:  1. High-grade dysplasia of the cervix.  2. Menometrorrhagia.  3. Dysmenorrhea.   POSTOPERATIVE DIAGNOSES:  1. High-grade dysplasia of the cervix.  2. Menometrorrhagia.  3. Dysmenorrhea.   PROCEDURE:  1. Laser ablation of cervix for high-grade squamous intraepithelial      lesion.  2. Hysteroscopy, dilatation and curettage, endometrial ablation for      menometrorrhagia and  dysmenorrhea.   SURGEON:  Lazaro Arms, M.D.   ANESTHESIA:  General endotracheal.   FINDINGS:  The patient had come to the office, referred because of  abnormal Pap smear.  I did a colpo-biopsy, which revealed high-grade  dysplasia.  As result she needed to have a laser ablation of cervix.  Additionally, the patient has had menometrorrhagia and dysmenorrhea for  some time, has normal ultrasound, no chronic pelvic pain, no  dyspareunia, and she was a candidate for ablation.   DESCRIPTION OF OPERATION:  The patient was taken to the operating room,  placed in supine position where she underwent general endotracheal  anesthesia.  She was prepped and draped in usual sterile fashion after  being placed in lithotomy position.  I did the hysteroscopy, D and C,  endometrial ablation first.  There were no abnormalities of endometrium,  no polyps, no fibroids, no abnormalities.  Uterine curettage performed  with really no tissue.  She was status post Megace.  Then endometrial  ablation was performed using ThermaChoice III endometrial ablation  balloon without difficulty.  Total therapy time was 9 minutes 33 seconds  and 16 cc of  fluid was used.  All fluid was returned for the procedure.  Then I used the Harmonic scalpel and did an ablation of the  transformation  zone and got a good margin around the area of dysplasia, depth of 5-7 mm  laterally, coning down to 9 mm centrally.  Hemostasis was achieved with  the laser and Monsel solution.  Patient was then awakened from  anesthesia, taken to recovery room in good, stable condition.  All  counts correct x3.      Lazaro Arms, M.D.  Electronically Signed     LHE/MEDQ  D:  06/11/2008  T:  06/11/2008  Job:  [id number]

## 2010-05-18 NOTE — H&P (Signed)
[name], [name]            ACCOUNT NO.:  [id number]   MEDICAL RECORD NO.:  [id number]          PATIENT TYPE:  IPS   LOCATION:  0305                          FACILITY:  [facility]   PHYSICIAN:  Jasmine Pang, M.D. DATE OF BIRTH:  1970/05/16   DATE OF ADMISSION:  05/16/2008  DATE OF DISCHARGE:                       PSYCHIATRIC ADMISSION ASSESSMENT   IDENTIFYING INFORMATION:  This is a voluntary admission to the services  of Dr. Milford Cage.  This is a 40 year old separated white female.  The patient presented here to the [facility].  She stated  She was just really depressed for the past week.  She reported having  attempted suicide by overdose last Saturday with no medical followup.  She cannot identify any precipitating stressors.  She continues to  endorse feeling suicidal.  When asked about plans, the patient replies  that she has different ones, but I would rather not tell anyone.  She  showed a limited ability to abstract and was unable to answer questions  that required abstract thinking when seen in the admissions office.  She  states that she completely stopped all of her prescribed med's after  failure to suicide successfully by overdose last week.  She states that  she has poor appetite and sleep.  She does use marijuana at least weekly  and she states that if we could fix one symptom today it would be to  address her depression.   PAST PSYCHIATRIC HISTORY:  She first began taking medications after the  birth of her second daughter, about 15 years ago.  She reports that she  has been an inpatient here at the [facility] in the past.  She has also been admitted to [facility] and she is currently  followed as an outpatient by Jake Seats, NP and the therapist, Elsie Stain.   SOCIAL HISTORY:  She has a high school diploma.  She has been married  twice. She is currently separated.  Her three daughters are ages 17, 2  and 44.  She has  one 37-year-old son.  All the children are with her  mother.  She is applying for disability based on her mental status.   FAMILY HISTORY:  She states that her sister, brother and 27 year old  daughter are all diagnosed with bipolar.   ALCOHOL AND DRUG HISTORY:  She reports that she has used marijuana since  she was a teen, probably 1 joint every other day.   PRIMARY CARE PHYSICIAN:  Kofi A. Gerilyn Pilgrim, M.D. for her migraines.  As  already stated, she is already seeing Jake Seats, NP.   PAST MEDICAL HISTORY:  She states she is due to have 2 teeth pulled and  had been prescribed E-Mycin for this.   MEDICATIONS:  She recently picked up (May 03, 2008) Adderall #60 tablets,  (it does not say what dosage), she picked up hydrocodone/APAP 5/325, She  picked up something called Treximet 85/500 mg, atenolol 100 mg p.o.,  Geodon 60 mg p.o., Cymbalta 60 mg, temazepam 30 mg, omeprazole 20 mg  takes 2 a day, Sulfa-T 20, ketorolac 10 mg tabs also on  May 07, 2008.   DRUG ALLERGIES:  PENICILLIN.  This was a recently developed allergy.  She had whelps.   POSITIVE PHYSICAL FINDINGS:  She was a direct admission here to the  [facility].  She appears her stated age of 59.  She does  not currently have a migraine.  She does not currently have any other  reported health issues other than 2 teeth that are supposed to be  pulled.  VITAL SIGNS:  On admission, the vital signs show she is 67 inches tall,  weighs 215 pounds.  Her temperature was 98.1.  Her blood pressure was  162/119, to 118/61.  Pulse was 62 to 85 and respirations are 18.   LABORATORY DATA:  She did not have any labs ordered on admission.   MENTAL STATUS EXAM:  Today, she is alert and oriented.  She was casually  groomed.  She was adequately dressed and nourished.  Her speech was not  pressured.  Her mood is depressed.  Her affect is congruent.  Thought  processes are clear, rational, goal oriented.  Judgment and insight are   fair.  Concentration and memory are intact.  Intelligence is average.  She denies being suicidal or homicidal.  She denies any auditory or  visual hallucinations.   ASSESSMENT:  Axis I:  Mood disorder, not otherwise specified.  Axis II:  Rule out personality disorder.  Axis III:  Migraines for 2 to 3 years.  Axis IV:  Chronic mental illness.  Axis V:  35.   DISCUSSION AND PLANS:  When asked what medication had been of benefit to  her, she could not really answer and as she is so depressed and wants  her depression to be addressed, we felt we might give her a trial with  Abilify.  She states that on lithium she recently became toxic and  will not take it; Chantix she stated that she last took this about 1 to  2 weeks ago which was also the point at which her Xanax was not  refilled.  Today, we are not going to restart any of her medications and  she has been off them for 1 week.  We will just give her Abilify 10 mg  p.o. q.a.m. starting today and will also give her E-Mycin because she is  due to have her 2 teeth pulled and will give her Peridex gargle in the  A.M. and H.S.   ESTIMATED LENGTH OF STAY:  3 days.      Mickie Leonarda Salon, P.A.-C.      Jasmine Pang, M.D.  Electronically Signed    MD/MEDQ  D:  05/17/2008  T:  05/17/2008  Job:  [id number]

## 2010-05-21 NOTE — Op Note (Signed)
   NAME:  Jenna Floyd, Jenna Floyd                      ACCOUNT NO.:  [id number]   MEDICAL RECORD NO.:  [id number]                   PATIENT TYPE:  AMB   LOCATION:  DAY                                  FACILITY:  [facility]   PHYSICIAN:  Lazaro Arms, M.D.                DATE OF BIRTH:  1970/03/03   DATE OF PROCEDURE:  02/26/2002  DATE OF DISCHARGE:                                 OPERATIVE REPORT   PREOPERATIVE DIAGNOSES:  1. Multiparous female desires permanent sterilization.  2. Morbid obesity.   POSTOPERATIVE DIAGNOSES:  1. Multiparous female desires permanent sterilization.  2. Morbid obesity.   PROCEDURE:  Laparoscopic bilateral tubal ligation.   SURGEON:  Lazaro Arms, M.D.   ANESTHESIA:  General endotracheal.   FINDINGS:  The patient had normal uterus, tubes, and ovaries.   DESCRIPTION OF PROCEDURE:  The patient was taken to the operating room and  placed in the supine position, underwent general endotracheal anesthesia,  placed in the dorsal lithotomy position, prepped and draped in the usual  sterile fashion.  A Hulka tenaculum was placed for uterine manipulation.  Her bladder was drained.  She was then draped and an incision was made in  the umbilicus, dissected down to the fascia which was incised sharply.  Under direct visualization open laparoscopy was performed.  The trocar  sleeve was placed directly into the peritoneal cavity and the peritoneal  cavity was insufflated using CO2 gas.  Both tubes were identified and  burned, no resistance and beyond using electrocautery unit of approximately  3.5 cm segment bilaterally.  There was good hemostasis.  The gas was allowed  to escape.  The instruments were removed.  The fascia was closed with two  separate 0 Vicryl sutures.  A subcutaneous suture was then placed 3-0 Vicryl  and the skin was closed using skin staples.  The patient tolerated the  procedure well.  She experienced no blood loss and was taken to the recovery  room in good and stable condition.  All counts were correct.                                               Lazaro Arms, M.D.    [name]  D:  02/26/2002  T:  02/26/2002  Job:  [id number]

## 2010-05-21 NOTE — Discharge Summary (Signed)
NAMEREBEKKAH, POWLESS            ACCOUNT NO.:  [id number]   MEDICAL RECORD NO.:  [id number]          PATIENT TYPE:  IPS   LOCATION:  0303                          [facility]   PHYSICIAN:  Anselm Jungling, MD  DATE OF BIRTH:  [birth year]   DATE OF ADMISSION:  04/24/2006  DATE OF DISCHARGE:  04/28/2006                               DISCHARGE SUMMARY   IDENTIFYING DATA AND REASON FOR ADMISSION:  This was an inpatient  psychiatric admission for Cleva, a 40 year old woman, mother of four  children, living with her boyfriend.  She was a patient of Dr. Mitzi Hansen,  and also seeing a counselor named Otelia Limes.  She was  admitted due to increasing depression, hypersomnia, withdrawal, and  inability to function.  She had had previous diagnoses assigned to her  including ADHD, bipolar disorder, anxiety disorder, and obsessive-  compulsive disorder, according to the patient.  She came to [place] on  multiple psychotropic medications including Celexa, Adderall, Topamax,  Risperdal, Xanax, and Klonopin.  She had also been taking Prilosec for  GERD, and Lortab and naproxen for pain related to spinal disk problems.  The patient reported that at the time of admission to our facility, she  had no longer considered herself in treatment with Dr. Mitzi Hansen, for  reasons she could not explain.  She stated that there were problems and  complications regarding his treatment of her children.  She denied any  drug or alcohol abuse issues.  Please refer to the admission note for  further details pertaining to the symptoms, circumstances and history  that led to her hospitalization.  She was given an initial Axis I  diagnosis of mood disorder NOS.   MEDICAL AND LABORATORY:  The patient was medically and physically  assessed by the psychiatric nurse practitioner.  She was continued on  her usual regimen of a naproxen 500 mg twice daily for pain, but we felt  it in her best interest not to continue  with use of opiate analgesics.  There were no acute medical issues.  She was also continued on Protonix  40 mg twice daily for symptoms of GERD.   HOSPITAL COURSE:  The patient was admitted to the adult inpatient  psychiatric service.  She presented as a well-nourished, well-developed  woman who was alert, fully oriented, but very anxious and tearful, as  well as psychomotor restless.  There were no overt signs or symptoms of  psychosis or thought disorder.  She denied any active suicidal ideation  in the initial interview, and verbalized a strong desire for help.   She was continued on her regimen of Adderall, Klonopin, Celexa,  Risperdal, and Topamax.  She was involved in various therapeutic groups  and activities, and was a reasonably good participant in the treatment  program.   With regard to her medication regimen, we had extensive discussions  together.  She stated that she felt that she was in a depressive state  and felt that her medications were not working for depression.  We  discussed the various situational and stress-related reasons for her  being depressed, including  her children's mental health issues and  emotional demands, the demands of their schedules, and the difficulty of  the logistics of school, there medication, etc..   The patient reported that she had been groggy in the mornings and was  having trouble getting up and functioning and parenting in the morning.  We discussed this being possibly due to the large quantities of bedtime  medications that she had been prescribed.  Based upon this, I explained  to the patient that I did not see any compelling basis for any new  medication trials.  I urged the patient to focus on her stressors, and  acquiring more in the way of coping skills, and supports.   On the third hospital day, the patient appeared less distressed, and  more relaxed.  She had gotten in touch with [facility], which  was to come to the  hospital to see the patient and discussed the  community support and medication management program.  The patient  appeared to be stabilizing with a support and structure of the inpatient  program.  Because her stay was likely to be brief, we continued with our  plan of not conducting any new medication trials.  It was felt best that  medication changes be deferred to the [facility] psychiatrist that she would  be seeing in the near future.   Also on the third hospital day, there was a family session involving the  patient's boyfriend, which occurred over the telephone.  The patient  spoke with the boyfriend about plans to reduce her stresses upon arrival  at home.  The patient told her boyfriend about community support  services that were due to have the first visit at their home on May 03, 2006 to help with her disabled children.  She reported that she  thought that this was help lessen her feelings of being overwhelmed.   The patient was discharged on the following day, being in much better  spirits, more hopeful, less overwhelmed, and remaining free of any  thoughts of self-harm.  She agreed to the following aftercare plan.   AFTERCARE:  The patient was discharged with a plan to be seen by the  [facility] in her home on May 03, 2006 at 11:00 a.m.  In addition,  she was to see Dr. Kerri Perches May 16, 2006 at 11:00 a.m.   DISCHARGE MEDICATIONS:  1. Adderall XR 60 mg q.a.m.  2. Klonopin 1 mg b.i.d.  3. Naproxen 500 mg b.i.d.  4. Celexa 60 mg daily.  5. Risperdal 1 mg nightly.  6. Topamax 400 mg nightly.  7. Protonix 40 mg b.i.d.   DISCHARGE DIAGNOSES:  AXIS I:  Depressive disorder not otherwise  specified, rule out bipolar disorder, type 2, and history of attention  deficit hyperactivity disorder.  AXIS II:  Deferred.  AXIS III:  History of gastroesophageal reflux disease, and spinal pain.  AXIS IV:  Stressors severe. AXIS V:  Global assessment of functioning on discharge  60.      Anselm Jungling, MD  Electronically Signed     SPB/MEDQ  D:  05/12/2006  T:  05/13/2006  Job:  [id number]

## 2010-05-21 NOTE — Op Note (Signed)
NAME:  Jenna Floyd, Jenna Floyd                      ACCOUNT NO.:  [id number]   MEDICAL RECORD NO.:  [id number]                   PATIENT TYPE:  INP   LOCATION:  A418                                 FACILITY:  [facility]   PHYSICIAN:  Tilda Burrow, M.D.              DATE OF BIRTH:  1970-12-02   DATE OF PROCEDURE:  DATE OF DISCHARGE:                                 OPERATIVE REPORT   DESCRIPTION OF PROCEDURE:  Ms. Brien was admitted on the evening of  December 21 and as per plan was considered for balloon dilation. The cervix  was inaccessible and a very high station so plans were to use Cytotec for  cervical ripening and induction.  She received Cytotec at 8 o'clock and just  before midnight, 25 mcg each dose. She had very little response to those 2.  At 4 a.m., she was given 50 mcg in an attempt to stimulate labor. She then  developed a strong labor pattern over the next few hours. By 8:30 a.m., she  was 3 cm dilated, 50% -3 with amniotomy performed a scalp electrode applied.  The patient always carries babies quite high. She then developed into a  strong labor pattern without any Pitocin augmentation. She received Nubain  20 mg, Phenergan 12.5 mg Phenergan at approximately 8:50 a.m. for analgesia.  She was considered for requested epidural and at approximately 10 a.m. was  being hydrated for possible epidural but began to develop strong urge to  push, was checked and found to be completely dilated. She pushed briefly,  rotated and the baby was found to be in the occiput posterior presentation.  The baby was rotated manually from left occiput posterior to left occiput  anterior and then she delivered easily over an intact perineum a 9 pound 3  ounce female infant. Apgar __________. There was a nuchal cord x1. The infant  was delivered, tactile stimulation performed and bulb suction performed  after the body of the baby was expelled. The delivery was quite rapid so  bulb suctioning was not  possible earlier. The infant showed somewhat flaccid  tone and was taken promptly after cutting of the cord to the warmer,  whereupon diagnosed ventilation was utilized due to initial heart rate of  approximately 80 with immediate response and improved color. The infant  showed some grunting and Dr. Milford Cage was called to assist with assessment and  therapy. Narcan was administered. The patient remained stable, sedated, and  the cord placenta delivered. Cord blood gases were obtained. We then focused  on the infant. The mother remained dramatically sedated but was arousable.  Doctor assumed care of the infant and his notes are further documented in  his records.   Mother was monitored, uterus remained firm. IV Pitocin administered and she  remained somewhat sedated and somnolent but remained stable without  significant blood loss. After the infant had been taken to the nursery, cord  blood  samples were obtained including a cord blood CBC.                                               Tilda Burrow, M.D.    JVF/MEDQ  D:  12/24/2001  T:  12/24/2001  Job:  [id number]   cc:   Francoise Schaumann. Halm, D.O.  [address], [address]  [place]  [place] [address]  Fax: [phone]

## 2010-05-21 NOTE — Discharge Summary (Signed)
   NAME:  Jenna Floyd, Jenna Floyd                      ACCOUNT NO.:  [id number]   MEDICAL RECORD NO.:  [id number]                   PATIENT TYPE:  INP   LOCATION:  A420                                 FACILITY:  [facility]   PHYSICIAN:  Lazaro Arms, M.D.                DATE OF BIRTH:  02-22-1970   DATE OF ADMISSION:  10/24/2001  DATE OF DISCHARGE:  10/26/2001                                 DISCHARGE SUMMARY   DISCHARGE DIAGNOSES:  1. Intrauterine pregnancy at 29-1/[redacted] weeks gestation.  2. Class A2 diabetes.  3. Patterning for A2 diabetes.   PROCEDURE:  Diabetic patterning and admission daily care and discharge care.   Please refer to transcribed history and physical for details on admission to  the hospital.   HOSPITAL COURSE:  The patient was admitted, begun on 20 NPH and 10 of  regular in the morning, 10 of NPH and 10 of regular in the evening.  It was  basically adequate prior to discharge.  She was also given Prevacid and  Celexa  in the hospital and discharged to home on those as well.  Sugars did  quite well.  She was discharged home on 20 and 14 and 12 and 12 and knew how  to check her blood sugar and give her injections and was inserviced on  appropriate diabetic diet.  She was to come back to the office next week and  keep a strict diary of her blood sugars.                                               Lazaro Arms, M.D.    [name]  D:  12/18/2001  T:  12/19/2001  Job:  [id number]

## 2010-05-21 NOTE — H&P (Signed)
[facility]  Patient:    Jenna Floyd, Jenna Floyd                  MRN: [id number] Adm. Date:  [id number] Attending:  Denny Peon Dictator:   Candi Leash. Theressa Stamps, N.P.                   Psychiatric Admission Assessment  DATE OF ADMISSION:  June 22, 2000  PATIENT IDENTIFICATION:  This is a 40 year old divorced white female voluntarily admitted on June 22, 2000, for agitation, depression, and suicidal ideation.  HISTORY OF PRESENT ILLNESS:  The patient presents with a history of uncontrolled agitation.  She reports she "cant keep things under control", feeling very depressed.  The patient reports she was on her way to work yesterday when she came back home with depression and suicidal ideation with no specific plan.  She asked her mom to take her to the hospital.  The patient reports feeling very irritable DD:  06/22/00 TD:  06/22/00 Job: [id number]

## 2010-05-21 NOTE — H&P (Signed)
[facility]  Patient:    Jenna Floyd, Jenna Floyd                    MRN: [id number] Adm. Date:  06/22/00 Attending:  Netta Cedars, M.D. Dictator:   Candi Leash. Theressa Stamps, N.P.                   Psychiatric Admission Assessment  DATE OF ADMISSION:  June 22, 2000  PATIENT IDENTIFICATION:  This is a 40 year old divorced white female voluntarily admitted on June 22, 2000, for agitation, crying, depression, and suicidal ideation.  HISTORY OF PRESENT ILLNESS:  The patient presents with a history of uncontrolled agitation and depression.  The patient reports she "cant keep things under control."  The patient reports that she was on her way to work yesterday.  She came back home feeling depressed, having suicidal ideation with no specific plan requesting to go to the hospital.  The patient reports feeling very irritable and hateful toward others with episodes of crying spells, just having increased mood swings.  She reports racing thoughts.  The patient reports being up at night.  She reports no auditory or visual hallucinations or paranoia.  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  Appetite has been fair.  Currently denying any suicidal ideation and will contract.  The patient describes her irritability as she hollers and snaps at people.  PAST PSYCHIATRIC HISTORY:  She is a patient of Dr. Milford Cage in [place], has been seeing her for about nine months, last visit in May 2002; is to be referred to another psychiatrist in August.   She had an episode of a suicide attempt as a teenager.  SUBSTANCE ABUSE HISTORY:  She smokes some.  Denies any alcohol or substance abuse.  PAST MEDICAL HISTORY:  Primary care Malay Fantroy is Dr. Megan Mans in [place]. Medical problems: GERD, migraines.  Medications: Paxil 30 mg every day for eight months, Xanax 0.5 mg p.r.n. for the past four to five months, Prevacid 30 mg every day, Ultracet p.r.n. for headaches,  and birth control pills.  Drug allergies: No known drug allergies.  Physical examination was performed at [facility].  Urine drug screen was negative.  Potassium 3.4.  WBC elevated slightly at 11.4.  SOCIAL HISTORY:  A 40 year old divorced white female, been divorced for two months, married for seven years.  She is in a current relationship right now. She has three children ages 52, 55, and 1.  She lives with her children.  She is a CNA, has been for the past three years.  She completed high school.  She has no legal problems.  Family depressed: Mother who is depressed, brother who has "problems."  MENTAL STATUS EXAMINATION:  Alert, young, overweight Caucasian female casually dressed.  Cooperative, good eye contact, scratching her arms constantly during the interview.  Speech is normal tone and relevant.  Mood is anxious and mildly irritable.  Affect is blunted and anxious.  Thought processes are coherent.  No evidence of psychosis, no auditory or visual hallucinations, no suicidal or homicidal ideations, no paranoia.  Cognitive functioning is intact.  Memory is good.  Judgment is fair.  Insight is good.  Appears to have average intelligence.  ADMISSION DIAGNOSES: Axis I:    1. Major depression.            2. Anxiety disorder.            3. Rule out bipolar disorder. Axis II:   Deferred. Axis  III:  1. Gastroesophageal reflux disease.            2. Migraines. Axis IV:   Mild with problems related to primary support group. Axis V:    Current is 35, estimated past year is 95.  INITIAL PLAN OF CARE:  Voluntary admission to [facility] for depression, suicidal ideation, and anxiety.  Contract for safety, check every 15 minutes.  Will resume her Paxil and birth control pills.  Will obtain a thyroid and urine pregnancy test.  Will initiate Zyprexa for racing thoughts. Plan is to return the patient to her prior living arrangements, to stabilize her mood and thinking so the  patient and others can be safe, and to follow up with Dr. Katrinka Blazing until August.  ESTIMATED LENGTH OF STAY:  Four to five days. DD:  06/22/00 TD:  06/22/00 Job: [id number]

## 2010-05-21 NOTE — Op Note (Signed)
[place]  Patient:    Jenna Floyd, Jenna Floyd                    MRN: [id number] Proc. Date: 05/17/99 Attending:  Molly Maduro L. Lyman Bishop, M.D.                           Operative Report  PREOPERATIVE DIAGNOSIS:  Hypertrophic chronic adenotonsillitis.  POSTOPERATIVE DIAGNOSIS:  Hypertrophic chronic adenotonsillitis.  OPERATION PERFORMED:  Tonsillectomy and adenoidectomy.  SURGEON:  Robert L. Lyman Bishop, M.D.  ANESTHESIA:  General.  PROCEDURE:  This 40 year old white female has had a history of chronic, recurring tonsillitis most of her life.  Over the past year, has had 4-5 episodes requiring antibiotics.  Some otolaryngologist elsewhere, who 2-3 years ago recommended T&A.  Patient has had some difficulty breathing through her nose and snoring at night.  Examination showed a traumatic displacement of the nasal septum to the right with enlarged left inferior turbinate and also 3+ enlarged very cryptic tonsils and moderately enlarged adenoids and somewhat enlarged jugulodigastric nodes bilaterally.  Patient admitted for surgery.  After satisfactory general endotracheal anesthesia had been induced, a Marlyne Beards mouthgag was inserted and retracting of the soft palate, a moderate midline pad of adenoid tissue was removed with a curet and punch forceps. Bleeding controlled with light cautery and a packs.  Following which, a Crowe-Davis mouthgag was inserted and suspended from the [facility] stand. Examination showed 3-4+ enlarged very cryptic tonsils, which merged with a large lingual tonsil on each side.  The tonsils were excised by way of electrodissection along with the contiguous lingual tonsil.  Bleeding controlled with cautery.  Estimated blood loss 100-150 cc.  Patient tolerated the procedure well, was awakened from anesthesia and taken to the recovery room in satisfactory condition. DD:  05/17/99 TD:  05/18/99 Job: [id number]

## 2010-05-21 NOTE — Discharge Summary (Signed)
Jenna Floyd, Jenna Floyd            ACCOUNT NO.:  [id number]   MEDICAL RECORD NO.:  [id number]          PATIENT TYPE:  IPS   LOCATION:  0300                          FACILITY:  [facility]   PHYSICIAN:  Jeanice Lim, M.D. DATE OF BIRTH:  06-Dec-1970   DATE OF ADMISSION:  11/18/2004  DATE OF DISCHARGE:  11/22/2004                                 DISCHARGE SUMMARY   IDENTIFYING DATA:  This is a 40 year old Caucasian female, married and  separated, voluntarily admitted.  Taken a handful of Effexor XR while  agitated.  Last month, took an overdose of everything in the house.  Described feeling overwhelmed.  Had been crying for three days, feeling  hopeless and thoughts of ending her life off and on for two weeks with  episodes of panic attacks.  Effexor XR she had been on for one year and  reported it not working.  Described some panic.  No clear mania and no  auditory or visual hallucinations.  [facility]  admission.  Has failed treatment with multiple SSRIs in the past.   MEDICATIONS:  Wellbutrin, B12 and folic acid.   ALLERGIES:  No known drug allergies.   PHYSICAL EXAMINATION:  Physical and neurologic exam within normal limits.   LABORATORY DATA:  Routine admission labs within normal limits.   MENTAL STATUS EXAM:  Fully alert, tearful, irritable affect, labile,  cooperative with decreased productivity.  Mood irritable, depressed,  hopeless.  Thought processes goal directed.  Poor insight.  Positive  suicidal ideation with plan to overdose.  Cognitively intact.  Judgment and  insight impaired.   ADMISSION DIAGNOSES:  AXIS I:  Major depressive disorder, recurrent, severe  versus bipolar disorder, type 2.  AXIS II:  Deferred.  AXIS III:  Status post SSRI overdose and headaches not otherwise specified  and obesity.  AXIS IV:  Severe (problems with occupation, problems related to legal  system).  AXIS V:  30/65.   HOSPITAL COURSE:  The patient was admitted and  ordered routine p.r.n.  medications and underwent further monitoring.  Was encouraged to participate  in individual, group and milieu therapy.  The patient was initiated on  lithium and Effexor was discontinued and patient was started on Celexa.  The  patient, after learning more about lithium and blood monitoring, had  gestational diabetes two times and was concerned about metabolic issues and  weight gain.  Admitted that she had not taken these medications at  discharge.  Was stopped off of lithium and Seroquel and medications were  adjusted still to stabilize clear mood instability but taken into account  patient's concern regarding her weight and metabolic concerns and, after  risk/benefit ratio and alternative treatments were discussed and medication  regimen was devised and patient reported a positive response and good  tolerance to these medications.   CONDITION ON DISCHARGE:  She was discharged in improved condition with a  stable mood.  No dangerous ideation.  Feeling hopeful and looking forward to  being compliant with outpatient treatment.  The patient was given medication  education again.   DISCHARGE MEDICATIONS:  1.  Effexor  XR 75 mg in the morning until November 26, 2004 and then 37.5 mg      in the morning until December 03, 2004 and then stop.  2.  Celexa 40 mg daily.  3.  Risperdal 0.5 mg at 8 p.m.  4.  Topamax 25 mg at 8 p.m. and then on November 22, 2004 increase to 2 at 8      p.m.  5.  Klonopin 0.5 mg b.i.d. and 1/2 every 12 hours as needed.  6.  Naprosyn 500 mg b.i.d.  7.  Protonix 40 mg daily.  8.  Lorcet 10/650 mg every six hours p.r.n.   FOLLOW UP:  The patient is to follow up with Dr. Omelia Blackwater and Zenia Resides on  November 23, 2004 at 3 p.m.   DISCHARGE DIAGNOSES:  AXIS I:  Major depressive disorder, recurrent, severe  versus bipolar disorder, type 2.  AXIS II:  Deferred.  AXIS III:  Status post SSRI overdose and headaches not otherwise specified  and  obesity.  AXIS IV:  Severe (problems with occupation, problems related to legal  system).  AXIS V:  GAF on discharge 55-60.      Jeanice Lim, M.D.  Electronically Signed     JEM/MEDQ  D:  11/28/2004  T:  11/28/2004  Job:  [id number]

## 2010-05-21 NOTE — Discharge Summary (Signed)
NAME:  Jenna Floyd, Jenna Floyd                      ACCOUNT NO.:  [id number]   MEDICAL RECORD NO.:  [id number]                   PATIENT TYPE:  INP   LOCATION:  A418                                 FACILITY:  [facility]   PHYSICIAN:  Tilda Burrow, M.D.              DATE OF BIRTH:  July 01, 1970   DATE OF ADMISSION:  12/23/2001  DATE OF DISCHARGE:  12/26/2001                                 DISCHARGE SUMMARY   ADMISSION DIAGNOSES:  1. Pregnancy 38 1/[redacted] weeks gestation.  2. Gestational diabetes, insulin dependent.  3. Large gestational age infant.  4. Medical induction of labor, indicated.   HISTORY OF PRESENT ILLNESS:  This 40 year old female gravida 5, para 3, AB 1  with type A2 diabetes is on insulin throughout the third trimester who is  admitted for induction of labor.  Prenatal course is notable for abnormal  MSAFP treated with amniocentesis and which showed a genetically normal female  infant.  Nonstress tests have been performed throughout the last month of  pregnancy.  Ultrasound at 36 weeks suggested a baby in the 95th percentile  estimated weight.  The cervix is somewhat favorable by patient's own history  being soft and vertex well applied, very high inside the pelvis.  The  patient carried her last pregnancy extremely high until after phase labor.   PAST MEDICAL HISTORY:  Positive for depression and migraine  headaches.   SURGICAL HISTORY:  Tonsillectomy in 2001.   FAMILY HISTORY:  Married _________ partner, Durene Cal.   ALLERGIES:  None.   MEDICATIONS:  1. Celexa 20 mg p.o. q.d.  2. Humulin 28 a.m., 28 R q.a.m. 20 units of regular q.p.m., 20 units of NPH     q.h.s.   PHYSICAL EXAMINATION:  GENERAL:  Height 5 feet 6 inches,  weight 333.  She  is an alert oriented, obese Caucasian female.  HEENT:  Pupils equal, round and reactive.  Extraocular movements intact.  NECK:  Supple. Trachea midline.  CHEST:  Clear to auscultation.  ABDOMEN:  Obese.  Large pendulous panniculus.   Estimated fetal weight  difficult to assess due to maternal girth, but likely in the nine pound  range.  Cervix 1-2 cm, soft, long, -3.  Vertex presentation.   HOSPITAL COURSE:  The patient was admitted on 12/21.  We were unable to  place a balloon.  ______ was used x3 doses with excellent response.  She  labored without requiring Pitocin.  She received Nubain and Phenergan. She  progressed rapidly.  Approximately 9 a.m. for a 9 pound 3 ounce female infant.  There was nuchal cord x1.  Baby showed some respiratory difficulty raising  the question of transient tachypnea or newborn versus HMV versus other  problems.  The mother had only received a single dose of  group B strep  prophylaxis.  The infant delivery was straight forward.  See delivery note.  Postpartum, the patient remained stable with postpartum hemoglobin  11.5 and  hematocrit 33.6 compared to preop values 11.1 and 32.9.  Maternal blood type  is B positive.  Condition on discharge remains stable.  Followup will be in  four weeks for postpartum tubal ligation.  The patient  has appointment with  Dr. Despina Hidden for 01/23/02.  She will go home on Celexa, multivitamins with iron,  Hemocyte.                                               Tilda Burrow, M.D.    JVF/MEDQ  D:  12/26/2001  T:  12/27/2001  Job:  [id number]

## 2010-05-21 NOTE — H&P (Signed)
NAME:  Jenna Floyd, Jenna Floyd                        ACCOUNT NO.:  [id number]   MEDICAL RECORD NO.:  [id number]                  PATIENT TYPE:   LOCATION:                                       FACILITY:  [facility]   PHYSICIAN:  Lazaro Arms, M.D.                DATE OF BIRTH:  1970/10/09   DATE OF ADMISSION:  DATE OF DISCHARGE:                                HISTORY & PHYSICAL   HISTORY OF PRESENT ILLNESS:  The patient is a 40 year old, white female,  gravida 5, para 3, abortus 1, with an estimated date of delivery of January 02, 2002, by a 15-week sonogram, currently at 29-1/[redacted] weeks gestation.  The  patient has a history of gestational diabetes with her last pregnancy and  had a one-hour GTT on October 11, 2001, was 204.  Her three-hour had a  fasting of 126, a one-hour of 246, a two-hour of 246, and a three-hour of  194 mg%.  With the elevated fasting and grossly abnormal values, she is  admitted for insulin patterning.   PAST MEDICAL HISTORY:  1. Depression.  2. Migraine.  3. Morbid obesity.   PAST SURGICAL HISTORY:  Tonsils in 2001.   PAST OBSTETRICAL HISTORY:  She had a vaginal delivery in 1993 and had  gestational diabetes.  In 1994, she had hypertension and was induced.  In  2001, she was induced with no problems and delivered with two pushes.  That  was a 9 pound 10 ounce baby, but she was not diabetic with that pregnancy,  but she is not sure if she was tested or not because she was delivered  through midwife services.  This pregnancy has been complicated otherwise by  an abnormal AFP with increased risk for Down syndrome.  She had an  amniocentesis which returned as 46XY.   ALLERGIES:  None.   MEDICATIONS:  1. Prevacid.  2. Prenatal vitamins.   REVIEW OF SYMPTOMS:  Otherwise negative.   PRENATAL LABORATORY DATA:  Blood type B+.  Antibody screen negative.  Serology nonreactive.  Rubella immune.  Hepatitis B negative.  HIV negative.  GC and chlamydia were negative.   She has a history of a positive group B  Streptococcus with the last pregnancy.  She had a urine drug screen positive  for marijuana.   PHYSICAL EXAMINATION:  HEENT:  Unremarkable.  NECK:  The thyroid is normal.  LUNGS:  Clear.  HEART:  Regular rhythm without murmur, rub, or gallop.  BREASTS:  Deferred.  ABDOMEN:  Fundal height is umbilicus plus 18 cm.  PELVIC:  Deferred.  EXTREMITIES:  Warm with no edema.  NEUROLOGIC:  Grossly intact.   IMPRESSION:  1. Intrauterine pregnancy at 29-1/[redacted] weeks gestation.  2. Class A2 diabetes.   PLAN:  The patient is admitted for diabetic patterning.  She will be here  for two to three days having that done.  We will probably also  draw some  laboratory work, including hemoglobin A1C to make sure she does not have any  underlying disease.                                               Lazaro Arms, M.D.    Jenna Floyd  D:  10/24/2001  T:  10/24/2001  Job:  [id number]

## 2010-07-16 ENCOUNTER — Other Ambulatory Visit: Payer: Self-pay | Admitting: Gastroenterology

## 2010-07-30 ENCOUNTER — Encounter: Payer: Self-pay | Admitting: Gastroenterology

## 2010-08-19 ENCOUNTER — Encounter: Payer: Self-pay | Admitting: Gastroenterology

## 2010-10-08 ENCOUNTER — Other Ambulatory Visit: Payer: Self-pay | Admitting: Gastroenterology

## 2010-11-03 ENCOUNTER — Ambulatory Visit: Payer: Medicaid Other | Admitting: Gastroenterology

## 2010-11-03 ENCOUNTER — Telehealth: Payer: Self-pay | Admitting: Gastroenterology

## 2010-11-03 NOTE — Telephone Encounter (Signed)
Pt was a no show

## 2010-11-06 ENCOUNTER — Other Ambulatory Visit: Payer: Self-pay | Admitting: Gastroenterology

## 2010-11-10 ENCOUNTER — Other Ambulatory Visit: Payer: Self-pay

## 2010-11-17 ENCOUNTER — Ambulatory Visit: Payer: Medicaid Other | Admitting: Gastroenterology

## 2011-07-07 ENCOUNTER — Encounter (HOSPITAL_COMMUNITY): Payer: Self-pay | Admitting: Emergency Medicine

## 2011-07-07 ENCOUNTER — Emergency Department (HOSPITAL_COMMUNITY)
Admission: EM | Admit: 2011-07-07 | Discharge: 2011-07-07 | Disposition: A | Discharge status: Home / Self Care | Payer: Self-pay | Attending: Emergency Medicine | Admitting: Emergency Medicine

## 2011-07-07 DIAGNOSIS — F17200 Nicotine dependence, unspecified, uncomplicated: Secondary | ICD-10-CM | POA: Insufficient documentation

## 2011-07-07 DIAGNOSIS — J329 Chronic sinusitis, unspecified: Secondary | ICD-10-CM

## 2011-07-07 DIAGNOSIS — F319 Bipolar disorder, unspecified: Secondary | ICD-10-CM | POA: Insufficient documentation

## 2011-07-07 DIAGNOSIS — R070 Pain in throat: Secondary | ICD-10-CM | POA: Insufficient documentation

## 2011-07-07 DIAGNOSIS — F429 Obsessive-compulsive disorder, unspecified: Secondary | ICD-10-CM | POA: Insufficient documentation

## 2011-07-07 DIAGNOSIS — F988 Other specified behavioral and emotional disorders with onset usually occurring in childhood and adolescence: Secondary | ICD-10-CM | POA: Insufficient documentation

## 2011-07-07 MED ORDER — PSEUDOEPHEDRINE HCL 60 MG PO TABS
60.0000 mg | ORAL_TABLET | Freq: Once | ORAL | Status: AC
  Administered 2011-07-07: 60 mg via ORAL
  Filled 2011-07-07: qty 1

## 2011-07-07 MED ORDER — AZITHROMYCIN 250 MG PO TABS: ORAL_TABLET | ORAL | Status: DC

## 2011-07-07 MED ORDER — AZITHROMYCIN 250 MG PO TABS
500.0000 mg | ORAL_TABLET | Freq: Once | ORAL | Status: AC
  Administered 2011-07-07: 500 mg via ORAL
  Filled 2011-07-07: qty 2

## 2011-07-07 MED ORDER — GUAIFENESIN-CODEINE 100-10 MG/5ML PO SYRP: 10.0000 mL | ORAL_SOLUTION | Freq: Three times a day (TID) | ORAL | Status: AC | PRN

## 2011-07-07 NOTE — ED Notes (Signed)
Patient c/o sore throat x 3 days.  Patient states she thought it was allergies, states throat feels raw.

## 2011-07-07 NOTE — ED Provider Notes (Signed)
History     CSN: [id number]  Arrival date & time 07/07/11  2034   First MD Initiated Contact with Patient 07/07/11 2048      Chief Complaint  Patient presents with  . Sore Throat    (Consider location/radiation/quality/duration/timing/severity/associated sxs/prior treatment) Patient is a 41 y.o. female presenting with pharyngitis. The history is provided by the patient.  Sore Throat This is a new problem. The current episode started in the past 7 days. The problem occurs constantly. The problem has been unchanged. Associated symptoms include congestion, coughing, a sore throat and swollen glands. Pertinent negatives include no arthralgias, chest pain, chills, fever, headaches, joint swelling, myalgias, nausea, neck pain, numbness, rash, urinary symptoms, vertigo, vomiting or weakness. Associated symptoms comments: Nasal congestion. The symptoms are aggravated by swallowing. She has tried acetaminophen for the symptoms. The treatment provided mild relief.    Past Medical History  Diagnosis Date  . ADD (attention deficit disorder with hyperactivity)   . OCD (obsessive compulsive disorder)   . Bipolar disorder   . S/P endoscopy June 2011    Dr. Darrick Penna: mild gastritis    Past Surgical History  Procedure Date  . Tonsillectomy and adenoidectomy   . Tubal ligation   . Water ablation therapy of cervix     No family history on file.  History  Substance Use Topics  . Smoking status: Current Everyday Smoker -- 0.3 packs/day for 20 years    Types: Cigarettes  . Smokeless tobacco: Not on file  . Alcohol Use: No    OB History    Grav Para Term Preterm Abortions TAB SAB Ect Mult Living                  Review of Systems  Constitutional: Negative for fever, chills, activity change and appetite change.  HENT: Positive for congestion, sore throat and rhinorrhea. Negative for facial swelling, trouble swallowing, neck pain and neck stiffness.   Eyes: Negative for visual disturbance.   Respiratory: Positive for cough. Negative for shortness of breath, wheezing and stridor.   Cardiovascular: Negative for chest pain.  Gastrointestinal: Negative for nausea and vomiting.  Musculoskeletal: Negative for myalgias, joint swelling and arthralgias.  Skin: Negative.  Negative for rash.  Neurological: Negative for dizziness, vertigo, weakness, numbness and headaches.  Hematological: Negative for adenopathy.  Psychiatric/Behavioral: Negative for confusion.  All other systems reviewed and are negative.    Allergies  Penicillins  Home Medications   Current Outpatient Rx  Name Route Sig Dispense Refill  . ACETAMINOPHEN 500 MG PO TABS Oral Take 1,000 mg by mouth as needed. FOR FEVER    . AMPHETAMINE-DEXTROAMPHETAMINE 30 MG PO TABS Oral Take 30 mg by mouth 2 (two) times daily.      . BUPROPION HCL ER (XL) 300 MG PO TB24 Oral Take 300 mg by mouth at bedtime.     . OMEPRAZOLE 20 MG PO CPDR      . TRAZODONE HCL 150 MG PO TABS Oral Take 300 mg by mouth at bedtime.       BP 144/69  Pulse 87  Temp 98 F (36.7 C) (Oral)  Resp 18  Ht 5\' 8"  (1.727 m)  Wt 210 lb (95.255 kg)  BMI 31.93 kg/m2  SpO2 100%  Physical Exam  Nursing note and vitals reviewed. Constitutional: She is oriented to person, place, and time. She appears well-developed and well-nourished. No distress.  HENT:  Head: Normocephalic and atraumatic. No trismus in the jaw.  Right Ear: Tympanic membrane  and ear canal normal.  Left Ear: Tympanic membrane and ear canal normal.  Mouth/Throat: Uvula is midline and mucous membranes are normal. No uvula swelling. Posterior oropharyngeal edema and posterior oropharyngeal erythema present. No oropharyngeal exudate or tonsillar abscesses.  Neck: Normal range of motion and phonation normal. Neck supple.  Cardiovascular: Normal rate, regular rhythm and normal heart sounds.   Pulmonary/Chest: Effort normal and breath sounds normal.  Musculoskeletal: Normal range of motion.    Lymphadenopathy:    She has cervical adenopathy.  Neurological: She is alert and oriented to person, place, and time. She exhibits normal muscle tone. Coordination normal.  Skin: Skin is warm and dry.    ED Course  Procedures (including critical care time)  Labs Reviewed - No data to display      MDM    Patient is alert. Nontoxic appearing. No focal neuro deficits. No meningeal signs.  Erythema of the oropharynx. Mild anterior cervical lymphadenopathy is present. Lung sounds are clear to auscultation bilaterally.  Handles her secretions well.      Patient / Family / Caregiver understand and agree with initial ED impression and plan with expectations set for ED visit. Pt stable in ED with no significant deterioration in condition. Pt feels improved after observation and/or treatment in ED.    The patient appears reasonably screened and/or stabilized for discharge and I doubt any other medical condition or other [facility] requiring further screening, evaluation, or treatment in the ED at this time prior to discharge.   Prescribed:  z-pack Guiatuss AC  Sharnay Cashion L. Aashish Hamm, [place] 07/11/11 1745

## 2011-07-14 NOTE — ED Provider Notes (Signed)
Medical screening examination/treatment/procedure(s) were performed by non-physician practitioner and as supervising physician I was immediately available for consultation/collaboration.   Shelda Jakes, MD 07/14/11 1256

## 2017-03-22 ENCOUNTER — Other Ambulatory Visit: Payer: Self-pay

## 2017-03-22 ENCOUNTER — Emergency Department (HOSPITAL_COMMUNITY): Payer: Self-pay

## 2017-03-22 ENCOUNTER — Encounter (HOSPITAL_COMMUNITY): Payer: Self-pay | Admitting: *Deleted

## 2017-03-22 ENCOUNTER — Emergency Department (HOSPITAL_COMMUNITY)
Admission: EM | Admit: 2017-03-22 | Discharge: 2017-03-23 | Disposition: A | Discharge status: Home / Self Care | Payer: Self-pay | Attending: Emergency Medicine | Admitting: Emergency Medicine

## 2017-03-22 DIAGNOSIS — R112 Nausea with vomiting, unspecified: Secondary | ICD-10-CM | POA: Insufficient documentation

## 2017-03-22 DIAGNOSIS — F17210 Nicotine dependence, cigarettes, uncomplicated: Secondary | ICD-10-CM | POA: Insufficient documentation

## 2017-03-22 DIAGNOSIS — J111 Influenza due to unidentified influenza virus with other respiratory manifestations: Secondary | ICD-10-CM | POA: Insufficient documentation

## 2017-03-22 MED ORDER — ACETAMINOPHEN 500 MG PO TABS
1000.0000 mg | ORAL_TABLET | Freq: Once | ORAL | Status: AC
  Administered 2017-03-22: 1000 mg via ORAL

## 2017-03-22 MED ORDER — KETOROLAC TROMETHAMINE 30 MG/ML IJ SOLN
30.0000 mg | Freq: Once | INTRAMUSCULAR | Status: AC
  Administered 2017-03-22: 30 mg via INTRAMUSCULAR
  Filled 2017-03-22: qty 1

## 2017-03-22 MED ORDER — ACETAMINOPHEN 500 MG PO TABS
ORAL_TABLET | ORAL | Status: AC
  Filled 2017-03-22: qty 2

## 2017-03-22 NOTE — ED Triage Notes (Signed)
Pt c/o generalized body aches, unsure of fever, does admit to chills that started two days ago,  admits to vomiting yesterday, denies any n/v/d today.

## 2017-03-23 MED ORDER — OSELTAMIVIR PHOSPHATE 75 MG PO CAPS: 75.0000 mg | ORAL_CAPSULE | Freq: Two times a day (BID) | ORAL | 0 refills | Status: DC

## 2017-03-23 NOTE — ED Notes (Signed)
Pt ambulatory to waiting room. Pt verbalized understanding of discharge instructions.   

## 2017-03-23 NOTE — ED Provider Notes (Signed)
[facility] Provider Note   CSN: [id number] Arrival date & time: 03/22/17  1922     History   Chief Complaint Chief Complaint  Patient presents with  . Influenza    HPI Jenna Floyd is a 47 y.o. female.  HPI   47 year old female with fever, body aches, nausea vomiting sore throat.  Symptom onset a couple days ago.  Persistent since then.  She states even her hair hurts.  Significant other with diagnosed flu approximately 1 week ago.  Past Medical History:  Diagnosis Date  . ADD (attention deficit disorder with hyperactivity)   . Bipolar disorder (HCC)   . OCD (obsessive compulsive disorder)   . S/P endoscopy June 2011   Dr. Darrick PennaFields: mild gastritis    Patient Active Problem List   Diagnosis Date Noted  . Sweating increase 05/13/2010  . GERD 07/21/2009  . EPIGASTRIC PAIN 07/21/2009    Past Surgical History:  Procedure Laterality Date  . TONSILLECTOMY AND ADENOIDECTOMY    . TUBAL LIGATION    . water ablation therapy of cervix      OB History   None      Home Medications    Prior to Admission medications   Medication Sig Start Date End Date Taking? Authorizing Provider  acetaminophen (TYLENOL) 500 MG tablet Take 1,000 mg by mouth as needed. FOR FEVER    [provider]  amphetamine-dextroamphetamine (ADDERALL) 30 MG tablet Take 30 mg by mouth 2 (two) times daily.      [provider]  azithromycin (ZITHROMAX Z-PAK) 250 MG tablet One tab po qd x 4 days 07/07/11   Triplett, Tammy, PA-C  buPROPion (WELLBUTRIN XL) 300 MG 24 hr tablet Take 300 mg by mouth at bedtime.     [provider]  omeprazole (PRILOSEC) 20 MG capsule  11/06/10   Tiffany KocherLewis, Leslie S, PA-C  oseltamivir (TAMIFLU) 75 MG capsule Take 1 capsule (75 mg total) by mouth every 12 (twelve) hours. 03/23/17   Raeford RazorKohut, Halsey Persaud, MD  traZODone (DESYREL) 150 MG tablet Take 300 mg by mouth at bedtime.     [provider]    Family History No family  history on file.  Social History Social History   Tobacco Use  . Smoking status: Current Every Day Smoker    Packs/day: 0.30    Years: 20.00    Pack years: 6.00    Types: Cigarettes  . Smokeless tobacco: Never Used  Substance Use Topics  . Alcohol use: No  . Drug use: No     Allergies   Penicillins   Review of Systems Review of Systems All systems reviewed and negative, other than as noted in HPI.   Physical Exam Updated Vital Signs BP 140/72 (BP Location: Right Arm)   Pulse 95   Temp 98.4 F (36.9 C) (Oral)   Resp 16   Ht 5\' 8"  (1.727 m)   Wt 118.7 kg (261 lb 11.2 oz)   SpO2 99%   BMI 39.79 kg/m   Physical Exam  Constitutional: She appears well-developed and well-nourished. No distress.  Laying in bed.  Appears somewhat uncomfortable, but nontoxic.  Obese.  HENT:  Head: Normocephalic and atraumatic.  Eyes: Conjunctivae are normal. Right eye exhibits no discharge. Left eye exhibits no discharge.  Neck: Neck supple.  Cardiovascular: Normal rate, regular rhythm and normal heart sounds. Exam reveals no gallop and no friction rub.  No murmur heard. Pulmonary/Chest: Effort normal and breath sounds normal. No respiratory distress.  Abdominal: Soft. She exhibits no distension. There is no tenderness.  Musculoskeletal: She exhibits no edema or tenderness.  Neurological: She is alert.  Skin: Skin is warm and dry.  Psychiatric: She has a normal mood and affect. Her behavior is normal. Thought content normal.  Nursing note and vitals reviewed.    ED Treatments / Results  Labs (all labs ordered are listed, but only abnormal results are displayed) Labs Reviewed - No data to display  EKG  EKG Interpretation None       Radiology Dg Chest 2 View  Result Date: 03/22/2017 CLINICAL DATA:  Cough and fever since Monday evening. Short of breath. Current smoker. Recent episode of bronchitis. EXAM: CHEST - 2 VIEW COMPARISON:  None. FINDINGS: Normal cardiac silhouette  and mediastinal contours. There is mild diffuse slightly nodular thickening of the pulmonary interstitium. No discrete focal airspace opacities. There is mild elevation of the right hemidiaphragm. No pleural effusion or pneumothorax. No evidence of edema. No acute osseus abnormalities. IMPRESSION: Findings suggestive of airways disease / bronchitis. No focal airspace opacities to suggest pneumonia. Electronically Signed   By: Simonne Come M.D.   On: 03/22/2017 20:00    Procedures Procedures (including critical care time)  Medications Ordered in ED Medications  acetaminophen (TYLENOL) tablet 1,000 mg (1,000 mg Oral Given 03/22/17 2233)  ketorolac (TORADOL) 30 MG/ML injection 30 mg (30 mg Intramuscular Given 03/22/17 2333)     Initial Impression / Assessment and Plan / ED Course  I have reviewed the triage vital signs and the nursing notes.  Pertinent labs & imaging results that were available during my care of the patient were reviewed by me and considered in my medical decision making (see chart for details).     48 year old female with flulike symptoms.  Clinically this is influenza.  She is nontoxic in appearance.  I doubt serious bacterial illness or other emergent process.  Plan symptomatic treatment.  Although symptoms have been going on for couple days she is requesting Tamiflu.  I think this is reasonable.  Prescription was provided.  Plenty rest.  Fluids.  NSAIDs as needed for aches and fever.  Return precautions were discussed.  Work note provided.  Final Clinical Impressions(s) / ED Diagnoses   Final diagnoses:  Influenza    ED Discharge Orders        Ordered    oseltamivir (TAMIFLU) 75 MG capsule  Every 12 hours     03/23/17 0003       Raeford Razor, MD 03/23/17 [phone]

## 2017-08-23 ENCOUNTER — Other Ambulatory Visit: Payer: Self-pay

## 2017-08-23 ENCOUNTER — Encounter (HOSPITAL_COMMUNITY): Payer: Self-pay | Admitting: Emergency Medicine

## 2017-08-23 ENCOUNTER — Emergency Department (HOSPITAL_COMMUNITY)
Admission: EM | Admit: 2017-08-23 | Discharge: 2017-08-24 | Disposition: A | Discharge status: Home / Self Care | Payer: Self-pay | Attending: Emergency Medicine | Admitting: Emergency Medicine

## 2017-08-23 DIAGNOSIS — L03319 Cellulitis of trunk, unspecified: Secondary | ICD-10-CM

## 2017-08-23 DIAGNOSIS — Z79899 Other long term (current) drug therapy: Secondary | ICD-10-CM | POA: Insufficient documentation

## 2017-08-23 DIAGNOSIS — L02219 Cutaneous abscess of trunk, unspecified: Secondary | ICD-10-CM

## 2017-08-23 DIAGNOSIS — F17210 Nicotine dependence, cigarettes, uncomplicated: Secondary | ICD-10-CM | POA: Insufficient documentation

## 2017-08-23 LAB — CBC WITH DIFFERENTIAL/PLATELET
BASOS ABS: 0 10*3/uL (ref 0.0–0.1)
Basophils Relative: 0 %
EOS PCT: 1 %
Eosinophils Absolute: 0.1 10*3/uL (ref 0.0–0.7)
HEMATOCRIT: 38.5 % (ref 36.0–46.0)
Hemoglobin: 13.4 g/dL (ref 12.0–15.0)
LYMPHS PCT: 21 %
Lymphs Abs: 3.2 10*3/uL (ref 0.7–4.0)
MCH: 31.9 pg (ref 26.0–34.0)
MCHC: 34.8 g/dL (ref 30.0–36.0)
MCV: 91.7 fL (ref 78.0–100.0)
MONO ABS: 0.8 10*3/uL (ref 0.1–1.0)
Monocytes Relative: 5 %
NEUTROS ABS: 11.4 10*3/uL — AB (ref 1.7–7.7)
Neutrophils Relative %: 73 %
Platelets: 324 10*3/uL (ref 150–400)
RBC: 4.2 MIL/uL (ref 3.87–5.11)
RDW: 12.8 % (ref 11.5–15.5)
WBC: 15.5 10*3/uL — ABNORMAL HIGH (ref 4.0–10.5)

## 2017-08-23 MED ORDER — LIDOCAINE-EPINEPHRINE-TETRACAINE (LET) SOLUTION
3.0000 mL | Freq: Once | NASAL | Status: AC
Start: 2017-08-23 — End: 2017-08-23
  Administered 2017-08-23: 3 mL via TOPICAL
  Filled 2017-08-23: qty 3

## 2017-08-23 MED ORDER — LIDOCAINE HCL (PF) 2 % IJ SOLN
INTRAMUSCULAR | Status: AC
  Filled 2017-08-23: qty 20

## 2017-08-23 MED ORDER — DOXYCYCLINE HYCLATE 100 MG PO TABS
100.0000 mg | ORAL_TABLET | Freq: Once | ORAL | Status: AC
  Administered 2017-08-23: 100 mg via ORAL
  Filled 2017-08-23: qty 1

## 2017-08-23 NOTE — ED Provider Notes (Signed)
[facility] EMERGENCY DEPARTMENT Provider Note   CSN: [id number] Arrival date & time: 08/23/17  2213     History   Chief Complaint Chief Complaint  Patient presents with  . Abscess    HPI Jenna Floyd is a 47 y.o. female.  HPI  This is a 47 year old female with a history of bipolar disorder, obesity, recurrent abscess who presents with an abscess to the left upper abdomen.  Patient reports 1 week history of increasing redness, swelling, pain left upper abdomen.  She has noted over the last 24 hours drainage.  She states that she sterilized some tweezers to try to make it drain more but does not feel like it has drained adequately.  She has had 2 prior abscesses over her abdominal wall for which she has had to come to the hospital.  Reports a temperature of 101.5 at home.  She denies any chest pain, shortness of breath, abdominal pain, nausea, vomiting.  No other rashes.  Past Medical History:  Diagnosis Date  . ADD (attention deficit disorder with hyperactivity)   . Bipolar disorder (HCC)   . OCD (obsessive compulsive disorder)   . S/P endoscopy June 2011   Dr. Darrick Penna: mild gastritis    Patient Active Problem List   Diagnosis Date Noted  . Sweating increase 05/13/2010  . GERD 07/21/2009  . EPIGASTRIC PAIN 07/21/2009    Past Surgical History:  Procedure Laterality Date  . TONSILLECTOMY AND ADENOIDECTOMY    . TUBAL LIGATION    . water ablation therapy of cervix       OB History   None      Home Medications    Prior to Admission medications   Medication Sig Start Date End Date Taking? Authorizing Provider  Aspirin-Acetaminophen-Caffeine (GOODY HEADACHE PO) Take 1 packet by mouth daily as needed (for fever/pain).   Yes [provider]  omeprazole (PRILOSEC) 20 MG capsule Take 20 mg by mouth every other day.  11/06/10  Yes Tiffany Kocher, PA-C  phenazopyridine (AZO DINE) 95 MG tablet Take 95 mg by mouth 3 (three) times daily as needed for pain.    Yes [provider]  ranitidine (ZANTAC) 75 MG tablet Take 75 mg by mouth daily as needed for heartburn.   Yes [provider]  chlorhexidine (HIBICLENS) 4 % external liquid Apply topically daily as needed. 08/24/17   Jasime Westergren, Mayer Masker, MD  doxycycline (VIBRAMYCIN) 100 MG capsule Take 1 capsule (100 mg total) by mouth 2 (two) times daily. 08/24/17   Keeli Roberg, Mayer Masker, MD  HYDROcodone-acetaminophen (NORCO/VICODIN) 5-325 MG tablet Take 1 tablet by mouth every 6 (six) hours as needed. 08/24/17   Clarke Amburn, Mayer Masker, MD    Family History History reviewed. No pertinent family history.  Social History Social History   Tobacco Use  . Smoking status: Current Every Day Smoker    Packs/day: 0.30    Years: 20.00    Pack years: 6.00    Types: Cigarettes  . Smokeless tobacco: Never Used  Substance Use Topics  . Alcohol use: Yes    Comment: occ  . Drug use: No     Allergies   Penicillins   Review of Systems Review of Systems  Constitutional: Positive for fever.  Respiratory: Negative for shortness of breath.   Cardiovascular: Negative for chest pain.  Gastrointestinal: Negative for abdominal pain, nausea and vomiting.  Genitourinary: Negative for dysuria.  Skin: Positive for color change. Negative for rash.  All other systems reviewed and are  negative.    Physical Exam Updated Vital Signs BP (!) 159/68 (BP Location: Right Arm)   Pulse (!) 111   Temp 98.3 F (36.8 C) (Oral)   Resp 18   SpO2 98%   Physical Exam  Constitutional: She is oriented to person, place, and time.  Obese, nontoxic-appearing  HENT:  Head: Normocephalic and atraumatic.  Eyes: Pupils are equal, round, and reactive to light.  Cardiovascular: Normal rate, regular rhythm and normal heart sounds.  No murmur heard. Pulmonary/Chest: Effort normal and breath sounds normal. No respiratory distress. She has no wheezes.  Abdominal: Soft. Bowel sounds are normal. There is no rebound and no  guarding.  Large area of erythema in the left upper quadrant with associated induration, small area of purulent drainage noted tender to palpation  Musculoskeletal: She exhibits no edema.  Neurological: She is alert and oriented to person, place, and time.  Skin: Skin is warm and dry.  Psychiatric: She has a normal mood and affect.  Nursing note and vitals reviewed.    ED Treatments / Results  Labs (all labs ordered are listed, but only abnormal results are displayed) Labs Reviewed  CBC WITH DIFFERENTIAL/PLATELET - Abnormal; Notable for the following components:      Result Value   WBC 15.5 (*)    Neutro Abs 11.4 (*)    All other components within normal limits  BASIC METABOLIC PANEL - Abnormal; Notable for the following components:   Potassium 3.0 (*)    Glucose, Bld 107 (*)    Calcium 8.6 (*)    All other components within normal limits    EKG None  Radiology No results found.  Procedures .Marland Kitchen.Incision and Drainage Date/Time: 08/24/2017 12:54 AM Performed by: Shon BatonHorton, Kahlen Morais F, MD Authorized by: Shon BatonHorton, Akashdeep Chuba F, MD   Consent:    Consent obtained:  Verbal   Consent given by:  Patient   Risks discussed:  Bleeding, incomplete drainage and pain   Alternatives discussed:  No treatment Location:    Type:  Abscess   Size:  4x5 cm   Location:  Trunk   Trunk location:  Abdomen Pre-procedure details:    Skin preparation:  Betadine Anesthesia (see MAR for exact dosages):    Anesthesia method:  Topical application and local infiltration   Topical anesthetic:  LET   Local anesthetic:  Lidocaine 2% w/o epi Procedure type:    Complexity:  Simple Procedure details:    Needle aspiration: no     Incision types:  Stab incision   Incision depth:  Dermal   Scalpel blade:  11   Wound management:  Probed and deloculated   Drainage:  Bloody and purulent   Drainage amount:  Moderate   Packing materials:  1/4 in iodoform gauze Post-procedure details:    Patient tolerance of  procedure:  Tolerated well, no immediate complications   (including critical care time)  Medications Ordered in ED Medications  lidocaine (XYLOCAINE) 2 % injection (has no administration in time range)  lidocaine-EPINEPHrine-tetracaine (LET) solution (3 mLs Topical Given 08/23/17 2324)  doxycycline (VIBRA-TABS) tablet 100 mg (100 mg Oral Given 08/23/17 2345)     Initial Impression / Assessment and Plan / ED Course  I have reviewed the triage vital signs and the nursing notes.  Pertinent labs & imaging results that were available during my care of the patient were reviewed by me and considered in my medical decision making (see chart for details).     Patient presents with redness and induration of  the left abdominal wall.  She is overall nontoxic-appearing.  Vital signs notable for heart rate of 111.  She is afebrile.  Reports fevers at home.  She is a large area of induration and likely abscess with some spontaneous drainage.  Lab work obtained given reports of fevers and size of induration.  Patient has a white count of 15.  Otherwise lab work is reassuring.  Patient is overall well-appearing and nonseptic appearing.  Discussed with patient does of IV antibiotics and drainage of abscess.  Patient does not wish to have an IV placed.  She was given a dose of doxycycline.  Abscess was drained and packing was placed.  Patient reports that she has a granddaughter being born tomorrow and wants to be discharged.  I discussed with her that given her systemic symptoms and white count, she needs to monitor for worsening symptoms closely.  If she has continued fevers, worsening redness, any new or worsening symptoms she needs to be reevaluated immediately.  Return in 2 days for recheck regardless.  Patient stated understanding.  Will discharge with doxycycline.  After history, exam, and medical workup I feel the patient has been appropriately medically screened and is safe for discharge home. Pertinent  diagnoses were discussed with the patient. Patient was given return precautions.   Final Clinical Impressions(s) / ED Diagnoses   Final diagnoses:  Cellulitis and abscess of trunk    ED Discharge Orders         Ordered    doxycycline (VIBRAMYCIN) 100 MG capsule  2 times daily     08/24/17 0053    HYDROcodone-acetaminophen (NORCO/VICODIN) 5-325 MG tablet  Every 6 hours PRN     08/24/17 0053    chlorhexidine (HIBICLENS) 4 % external liquid  Daily PRN     08/24/17 0053           Shon BatonHorton, Kylee Umana F, MD 08/24/17 [id number]

## 2017-08-23 NOTE — ED Triage Notes (Signed)
Pt noticed red/abscess area to left upper abd area.  Area is large, red, warm to touch and had draining area in middle. Pt states is draining bloody "pus". States this is third time this has happened.

## 2017-08-24 LAB — BASIC METABOLIC PANEL
Anion gap: 10 (ref 5–15)
BUN: 7 mg/dL (ref 6–20)
CALCIUM: 8.6 mg/dL — AB (ref 8.9–10.3)
CO2: 26 mmol/L (ref 22–32)
CREATININE: 0.8 mg/dL (ref 0.44–1.00)
Chloride: 103 mmol/L (ref 98–111)
GFR calc Af Amer: >60 mL/min (ref >60)
GFR calc non Af Amer: >60 mL/min (ref >60)
GLUCOSE: 107 mg/dL — AB (ref 70–99)
Potassium: 3 mmol/L — ABNORMAL LOW (ref 3.5–5.1)
Sodium: 139 mmol/L (ref 135–145)

## 2017-08-24 MED ORDER — CHLORHEXIDINE GLUCONATE 4 % EX LIQD: Freq: Every day | CUTANEOUS | 0 refills | Status: AC | PRN

## 2017-08-24 MED ORDER — HYDROCODONE-ACETAMINOPHEN 5-325 MG PO TABS: 1.0000 | ORAL_TABLET | Freq: Four times a day (QID) | ORAL | 0 refills | Status: AC | PRN

## 2017-08-24 MED ORDER — DOXYCYCLINE HYCLATE 100 MG PO CAPS: 100.0000 mg | ORAL_CAPSULE | Freq: Two times a day (BID) | ORAL | 0 refills | Status: AC

## 2017-08-24 NOTE — Discharge Instructions (Addendum)
You were seen today for abscess and cellulitis of her abdomen.  You were started on antibiotics.  If you noticed increasing redness, persistent fevers, any new or worsening symptoms you need to be reevaluated immediately.  Return in 2 days for recheck.

## 2017-08-27 ENCOUNTER — Other Ambulatory Visit: Payer: Self-pay

## 2017-08-27 ENCOUNTER — Encounter (HOSPITAL_COMMUNITY): Payer: Self-pay | Admitting: Emergency Medicine

## 2017-08-27 ENCOUNTER — Emergency Department (HOSPITAL_COMMUNITY)
Admission: EM | Admit: 2017-08-27 | Discharge: 2017-08-27 | Disposition: A | Discharge status: Home / Self Care | Payer: Self-pay | Attending: Emergency Medicine | Admitting: Emergency Medicine

## 2017-08-27 DIAGNOSIS — Z79899 Other long term (current) drug therapy: Secondary | ICD-10-CM | POA: Insufficient documentation

## 2017-08-27 DIAGNOSIS — Z5189 Encounter for other specified aftercare: Secondary | ICD-10-CM

## 2017-08-27 DIAGNOSIS — X58XXXD Exposure to other specified factors, subsequent encounter: Secondary | ICD-10-CM | POA: Insufficient documentation

## 2017-08-27 DIAGNOSIS — S31109D Unspecified open wound of abdominal wall, unspecified quadrant without penetration into peritoneal cavity, subsequent encounter: Secondary | ICD-10-CM | POA: Insufficient documentation

## 2017-08-27 DIAGNOSIS — F319 Bipolar disorder, unspecified: Secondary | ICD-10-CM | POA: Insufficient documentation

## 2017-08-27 DIAGNOSIS — F17210 Nicotine dependence, cigarettes, uncomplicated: Secondary | ICD-10-CM | POA: Insufficient documentation

## 2017-08-27 DIAGNOSIS — F909 Attention-deficit hyperactivity disorder, unspecified type: Secondary | ICD-10-CM | POA: Insufficient documentation

## 2017-08-27 NOTE — ED Triage Notes (Signed)
Pt here to have packing removed from I&D area to abd that was drained 8/22. Denies fever, N/V/D/.

## 2017-08-27 NOTE — ED Provider Notes (Signed)
[facility] EMERGENCY DEPARTMENT Provider Note   CSN: [id number] Arrival date & time: 08/27/17  1058     History   Chief Complaint Chief Complaint  Patient presents with  . Wound Check    HPI Jenna Floyd is a 47 y.o. female.  The history is provided by the patient. No language interpreter was used.  Wound Check  This is a new problem. The current episode started more than 2 days ago. The problem occurs constantly. The problem has been gradually improving. Nothing aggravates the symptoms. Nothing relieves the symptoms. She has tried nothing for the symptoms. The treatment provided no relief.  Pt here for packing removal   Past Medical History:  Diagnosis Date  . ADD (attention deficit disorder with hyperactivity)   . Bipolar disorder (HCC)   . OCD (obsessive compulsive disorder)   . S/P endoscopy June 2011   Dr. Darrick Penna: mild gastritis    Patient Active Problem List   Diagnosis Date Noted  . Sweating increase 05/13/2010  . GERD 07/21/2009  . EPIGASTRIC PAIN 07/21/2009    Past Surgical History:  Procedure Laterality Date  . TONSILLECTOMY AND ADENOIDECTOMY    . TUBAL LIGATION    . water ablation therapy of cervix       OB History   None      Home Medications    Prior to Admission medications   Medication Sig Start Date End Date Taking? Authorizing Provider  Aspirin-Acetaminophen-Caffeine (GOODY HEADACHE PO) Take 1 packet by mouth daily as needed (for fever/pain).    [provider]  chlorhexidine (HIBICLENS) 4 % external liquid Apply topically daily as needed. 08/24/17   Horton, Mayer Masker, MD  doxycycline (VIBRAMYCIN) 100 MG capsule Take 1 capsule (100 mg total) by mouth 2 (two) times daily. 08/24/17   Horton, Mayer Masker, MD  HYDROcodone-acetaminophen (NORCO/VICODIN) 5-325 MG tablet Take 1 tablet by mouth every 6 (six) hours as needed. 08/24/17   Horton, Mayer Masker, MD  omeprazole (PRILOSEC) 20 MG capsule Take 20 mg by mouth every other day.   11/06/10   Tiffany Kocher, PA-C  phenazopyridine (AZO DINE) 95 MG tablet Take 95 mg by mouth 3 (three) times daily as needed for pain.    [provider]  ranitidine (ZANTAC) 75 MG tablet Take 75 mg by mouth daily as needed for heartburn.    [provider]    Family History History reviewed. No pertinent family history.  Social History Social History   Tobacco Use  . Smoking status: Current Every Day Smoker    Packs/day: 0.30    Years: 20.00    Pack years: 6.00    Types: Cigarettes  . Smokeless tobacco: Never Used  Substance Use Topics  . Alcohol use: Yes    Comment: occ  . Drug use: No     Allergies   Penicillins   Review of Systems Review of Systems  All other systems reviewed and are negative.    Physical Exam Updated Vital Signs BP (!) 159/101 (BP Location: Right Arm)   Pulse 91   Temp 98.1 F (36.7 C) (Oral)   Resp 16   Wt 113.5 kg   SpO2 98%   BMI 38.05 kg/m   Physical Exam  Constitutional: She appears well-developed and well-nourished.  Nursing note and vitals reviewed.  Packing removed from incision left side of abdomen.  Wound looks good. healing  ED Treatments / Results  Labs (all labs ordered are listed, but only abnormal results are  displayed) Labs Reviewed - No data to display  EKG None  Radiology No results found.  Procedures Procedures (including critical care time)  Medications Ordered in ED Medications - No data to display   Initial Impression / Assessment and Plan / ED Course  I have reviewed the triage vital signs and the nursing notes.  Pertinent labs & imaging results that were available during my care of the patient were reviewed by me and considered in my medical decision making (see chart for details).       Final Clinical Impressions(s) / ED Diagnoses   Final diagnoses:  Wound check, abscess   An After Visit Summary was printed and given to the patient.  ED Discharge Orders    None         Osie CheeksSofia, Leslie K, PA-C 08/27/17 1645    Donnetta Hutchingook, Brian, MD 08/28/17 [phone]

## 2017-08-27 NOTE — Discharge Instructions (Addendum)
Return if any problems.
